# Patient Record
Sex: Male | Born: 1955 | Race: White | Hispanic: No | Smoking: Former smoker
Health system: Southern US, Community
[De-identification: ages and names within clinical notes are randomized; demographics above are authoritative.]

## PROBLEM LIST (undated history)

## (undated) DIAGNOSIS — E785 Hyperlipidemia, unspecified: Secondary | ICD-10-CM

## (undated) DIAGNOSIS — I2510 Atherosclerotic heart disease of native coronary artery without angina pectoris: Secondary | ICD-10-CM

## (undated) DIAGNOSIS — I4510 Unspecified right bundle-branch block: Secondary | ICD-10-CM

## (undated) HISTORY — DX: Atherosclerotic heart disease of native coronary artery without angina pectoris: I25.10

## (undated) HISTORY — PX: CARDIAC CATHETERIZATION: SHX172

## (undated) HISTORY — DX: Unspecified right bundle-branch block: I45.10

## (undated) HISTORY — DX: Hyperlipidemia, unspecified: E78.5

---

## 2003-07-01 ENCOUNTER — Emergency Department (HOSPITAL_COMMUNITY): Admission: EM | Admit: 2003-07-01 | Discharge: 2003-07-01 | Payer: Self-pay | Admitting: *Deleted

## 2003-07-01 ENCOUNTER — Encounter: Payer: Self-pay | Admitting: *Deleted

## 2003-08-08 ENCOUNTER — Encounter: Payer: Self-pay | Admitting: General Surgery

## 2003-08-08 ENCOUNTER — Observation Stay (HOSPITAL_COMMUNITY): Admission: RE | Admit: 2003-08-08 | Discharge: 2003-08-09 | Payer: Self-pay | Admitting: *Deleted

## 2003-08-08 ENCOUNTER — Encounter (INDEPENDENT_AMBULATORY_CARE_PROVIDER_SITE_OTHER): Payer: Self-pay | Admitting: Specialist

## 2005-05-08 ENCOUNTER — Inpatient Hospital Stay (HOSPITAL_COMMUNITY): Admission: EM | Admit: 2005-05-08 | Discharge: 2005-05-13 | Payer: Self-pay | Admitting: Emergency Medicine

## 2005-06-04 ENCOUNTER — Encounter: Admission: RE | Admit: 2005-06-04 | Discharge: 2005-06-04 | Payer: Self-pay | Admitting: Surgery

## 2005-08-29 ENCOUNTER — Encounter (HOSPITAL_COMMUNITY): Admission: RE | Admit: 2005-08-29 | Discharge: 2005-11-27 | Payer: Self-pay | Admitting: Cardiovascular Disease

## 2006-01-28 ENCOUNTER — Emergency Department (HOSPITAL_COMMUNITY): Admission: EM | Admit: 2006-01-28 | Discharge: 2006-01-28 | Payer: Self-pay | Admitting: Emergency Medicine

## 2006-08-11 ENCOUNTER — Encounter: Admission: RE | Admit: 2006-08-11 | Discharge: 2006-08-11 | Payer: Self-pay | Admitting: Family Medicine

## 2007-01-26 IMAGING — CR DG CHEST 1V PORT
1 series · 1 of 1 positions shown · non-contrast
Comparison: Earlier exam today at 3323 hours.

CLINICAL DATA: CABG.  Chest pain. 
 PORTABLE CHEST - 1 VIEW 05/09/05 AT 2022 HOURS:

[view not recorded]
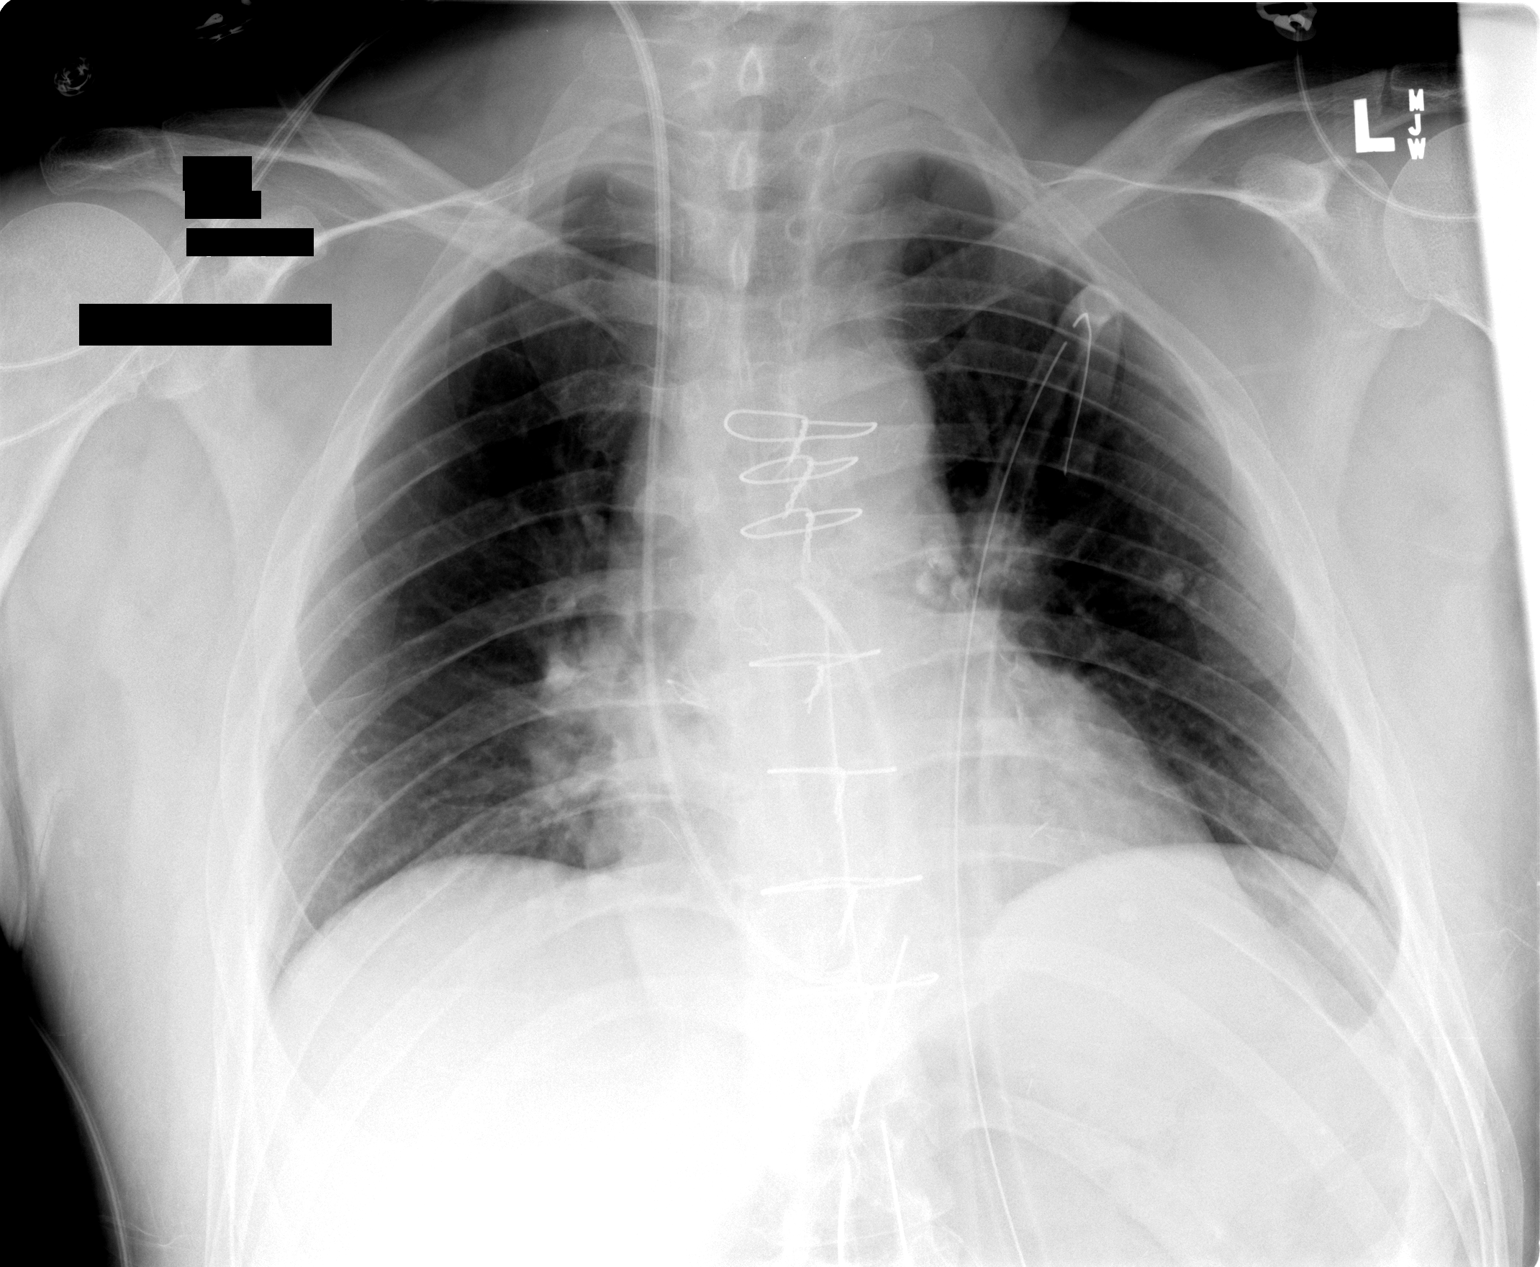

[1 of 1 positions shown; findings below may reference images not displayed]

FINDINGS: Mediastinal and left pleural chest tube in place. Swan Ganz catheter tip main pulmonary artery segment. Status post removal of ET and NG tubes.  Cardiomegaly.
IMPRESSION: CABG.  No acute interval changes.  No pneumothorax.

## 2007-01-28 IMAGING — CR DG CHEST 2V
2 series · 2 of 2 positions shown · non-contrast
Comparison: 05/10/05.

CLINICAL DATA: Post-op form coronary artery bypass grafting.  Chest tube removal.  
 CHEST - 2 VIEW:

[w chest pa]
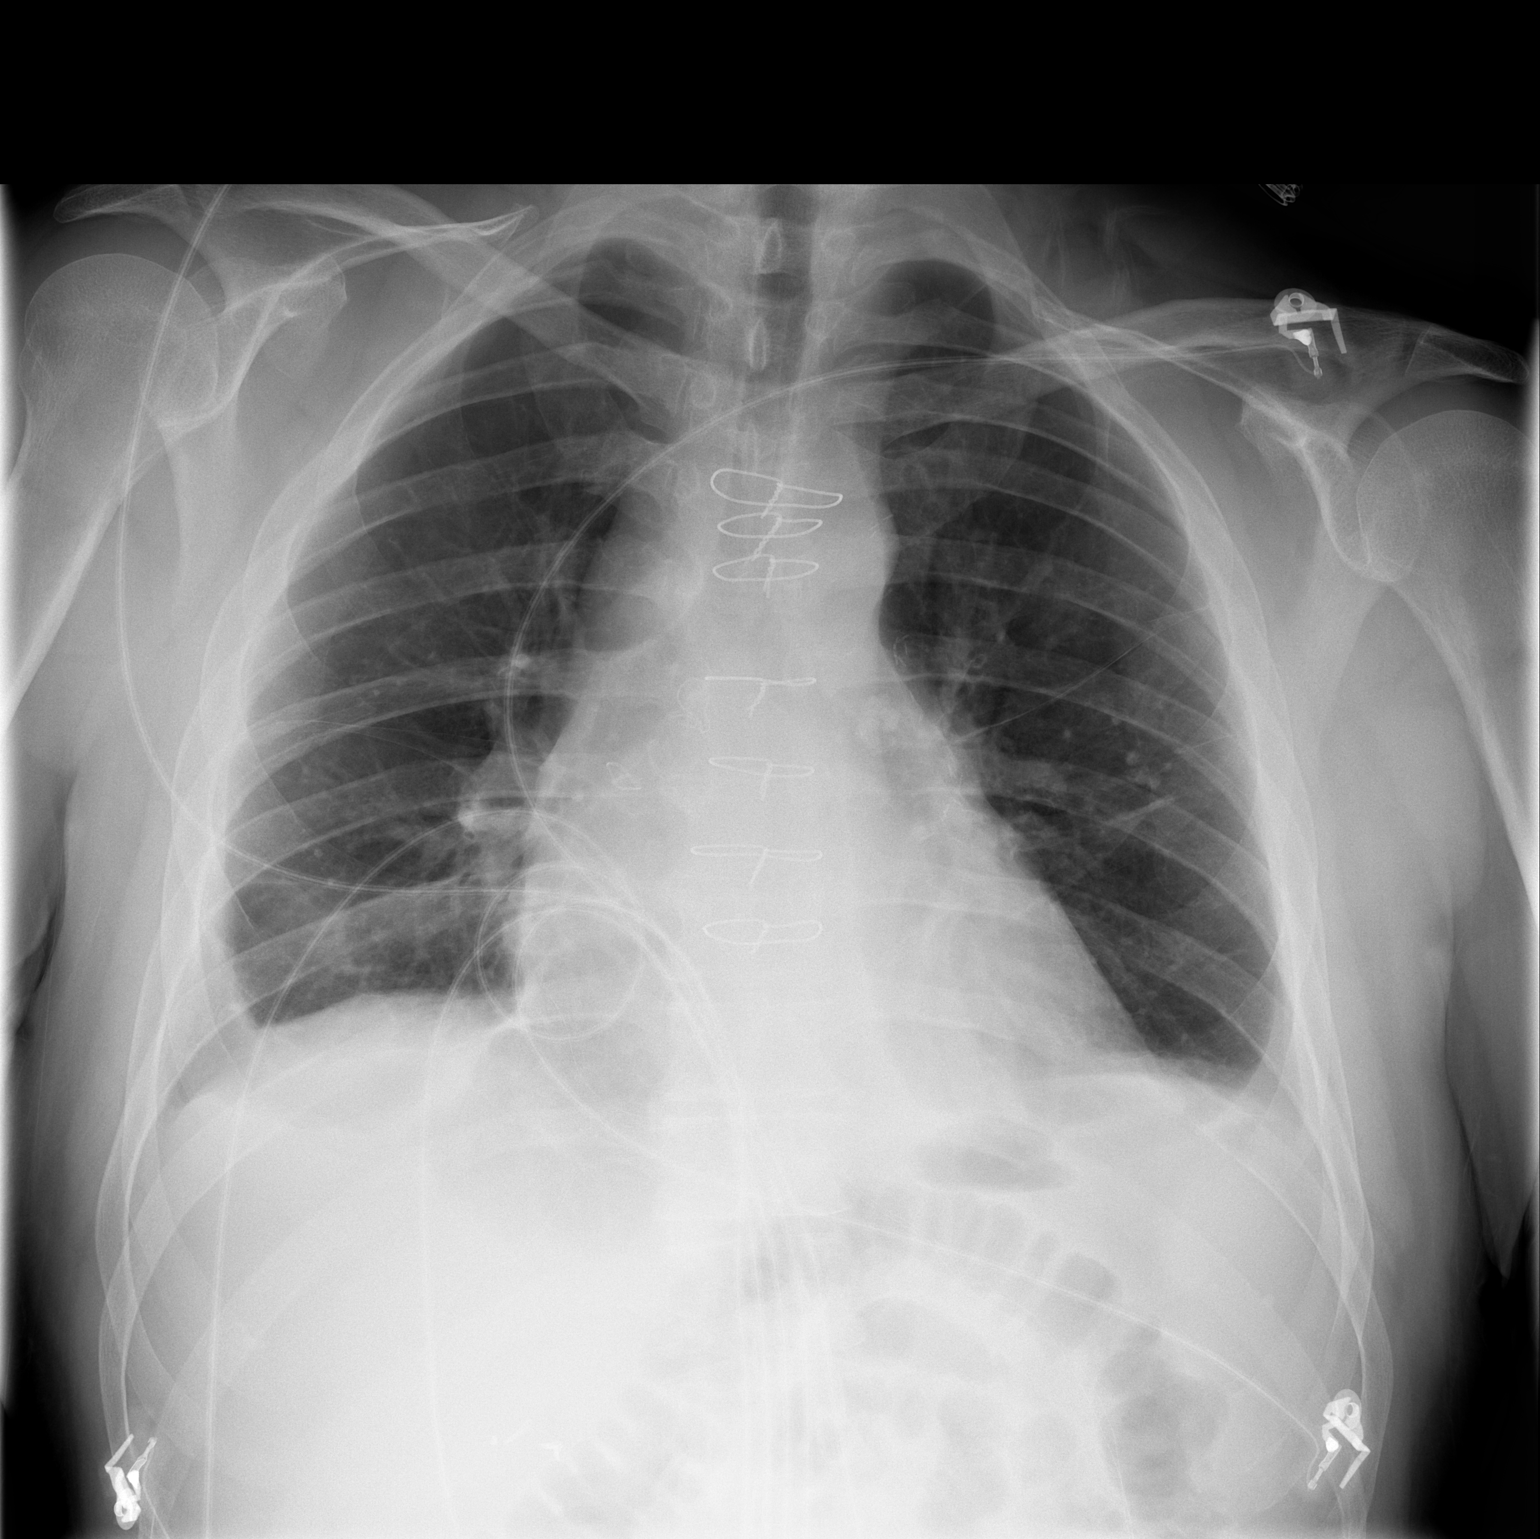

[w chest lat]
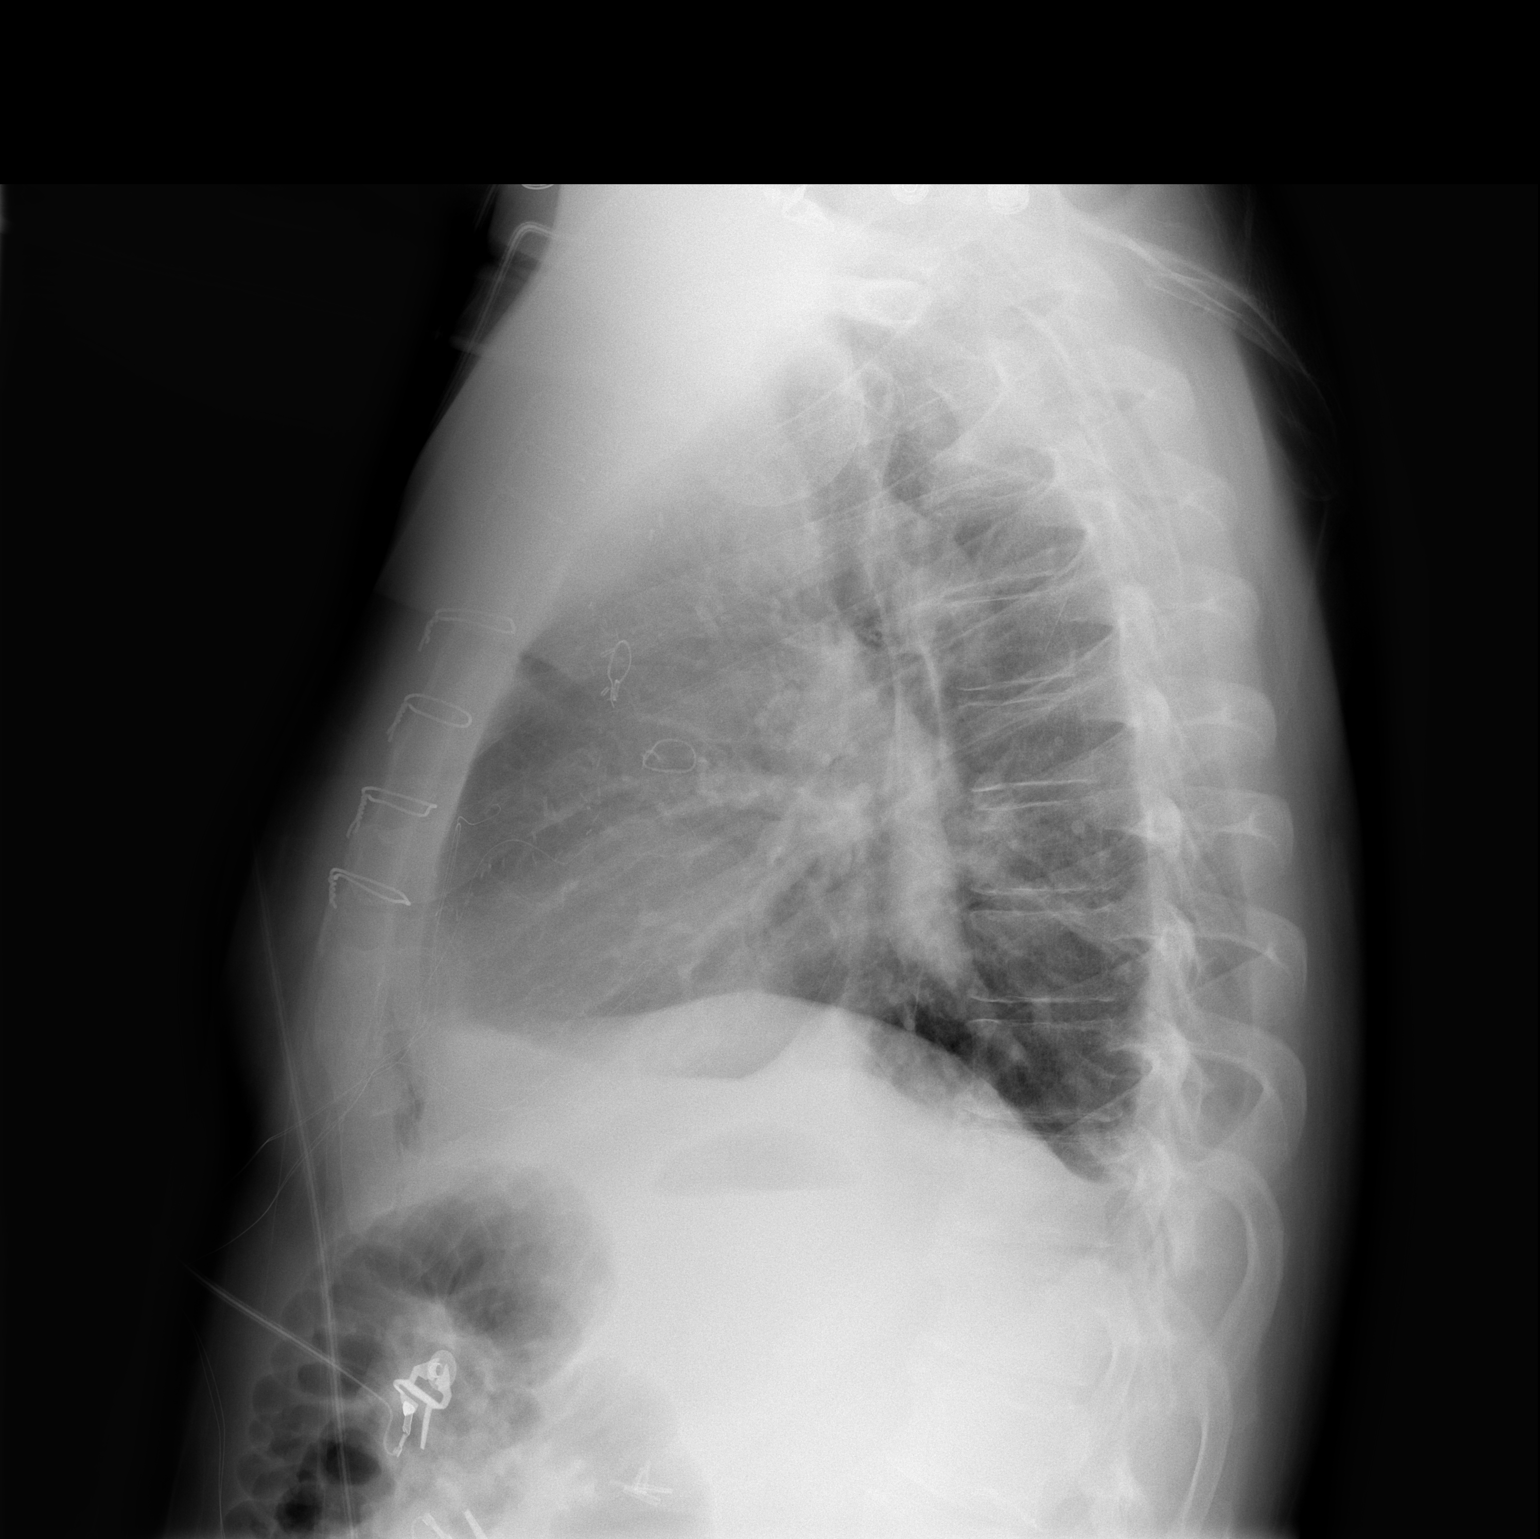

[2 of 2 positions shown; findings below may reference images not displayed]

The left chest tube has been removed.  There is no evidence of pneumothorax.  
 Tiny bilateral pleural effusions are seen as well as mild atelectasis in the medial left lung base.  Mild cardiomegaly is stable.  There is no evidence of congestive heart failure.
IMPRESSION: 1.  No evidence of pneumothorax following left chest tube removal.
 2.  Tiny bilateral pleural effusions and mild atelectasis in retrocardiac left lung base.

## 2008-10-14 ENCOUNTER — Inpatient Hospital Stay (HOSPITAL_COMMUNITY): Admission: EM | Admit: 2008-10-14 | Discharge: 2008-10-16 | Payer: Self-pay | Admitting: Emergency Medicine

## 2008-12-06 ENCOUNTER — Ambulatory Visit (HOSPITAL_BASED_OUTPATIENT_CLINIC_OR_DEPARTMENT_OTHER): Admission: RE | Admit: 2008-12-06 | Discharge: 2008-12-07 | Payer: Self-pay | Admitting: Urology

## 2010-07-03 IMAGING — US US RENAL PORT
1 series · 14 of 25 positions shown · non-contrast
Comparison: None.

CLINICAL DATA: Renal failure.

RENAL/URINARY TRACT ULTRASOUND
TECHNIQUE: Complete ultrasound examination of the urinary tract
was performed including evaluation of the kidneys, renal collecting
systems, and urinary bladder.

[Series 1: unknown · 0.30mm/px · 14 of 26 slices shown]
[im 1/26]
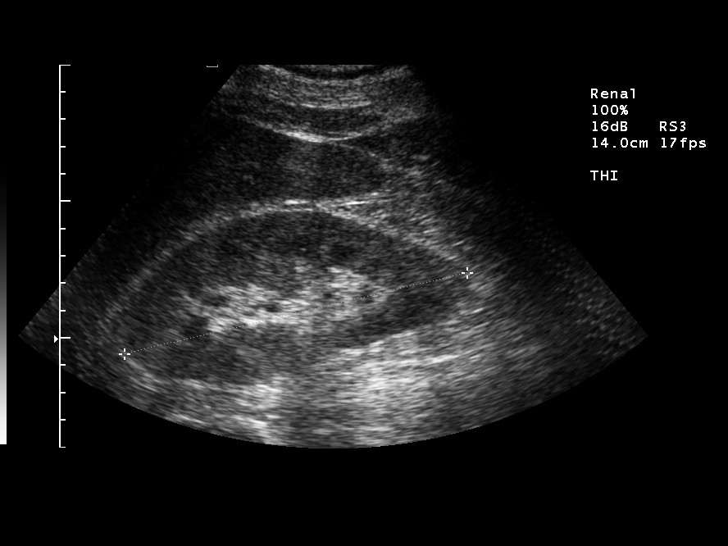
[im 3/26]
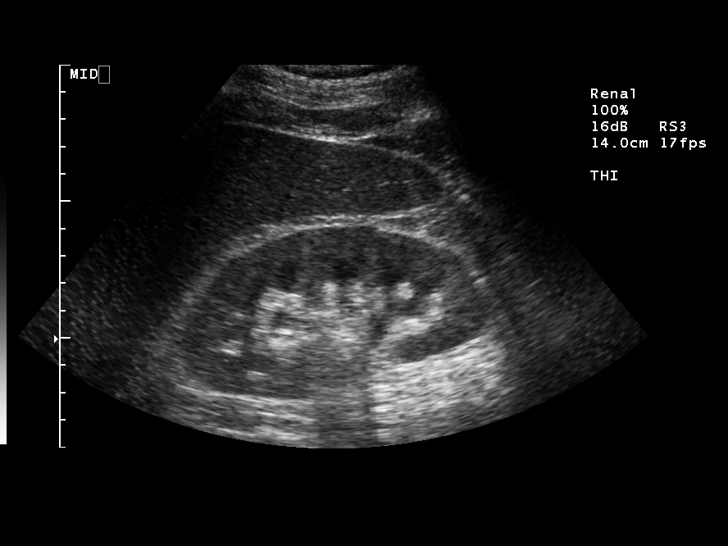
[im 5/26]
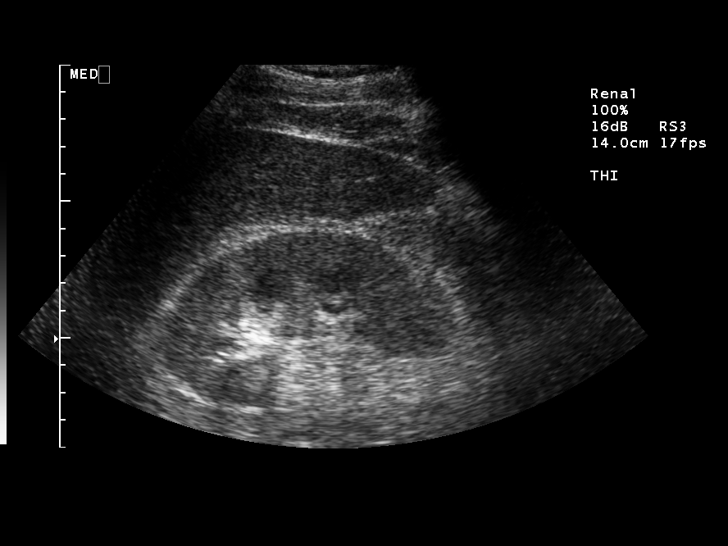
[im 7/26]
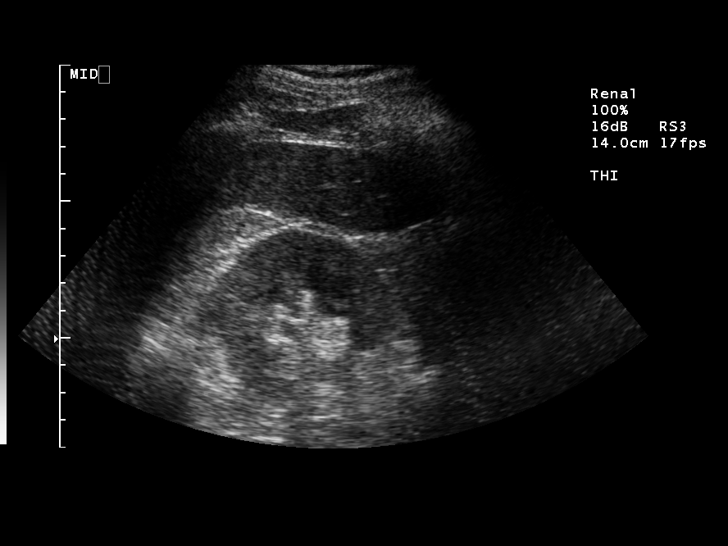
[im 9/26]
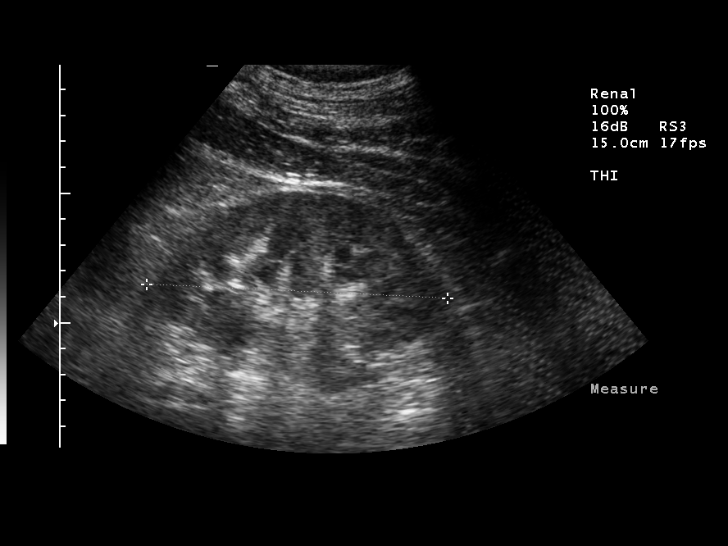
[im 10/26]
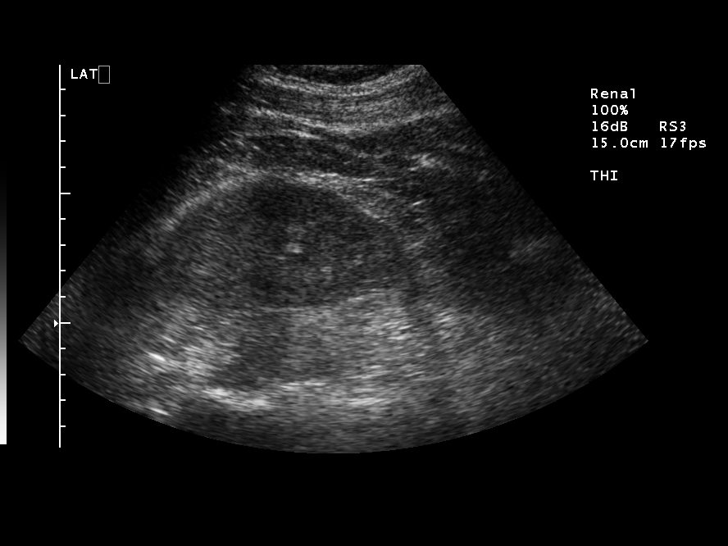
[im 12/26]
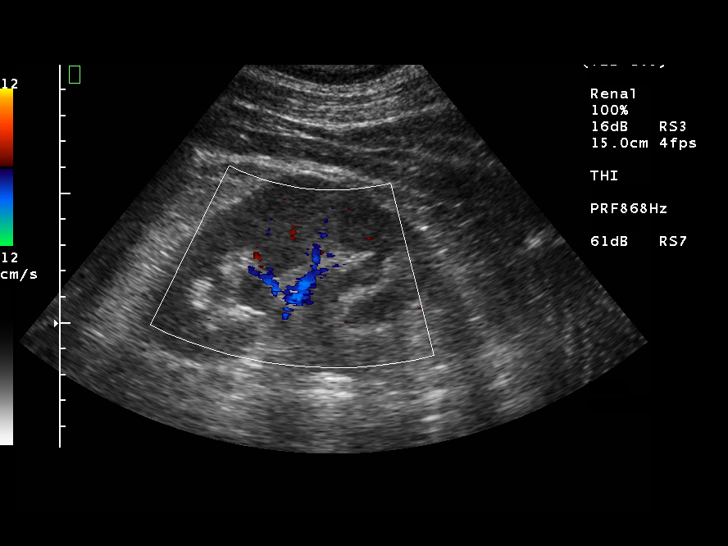
[im 14/26]
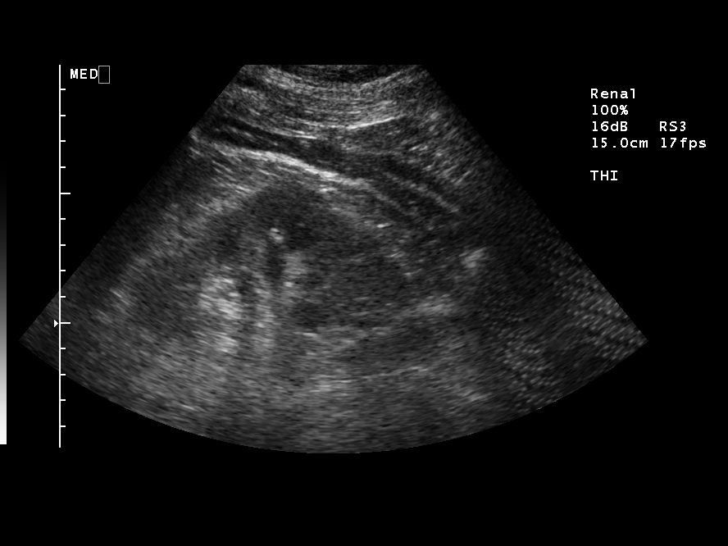
[im 16/26]
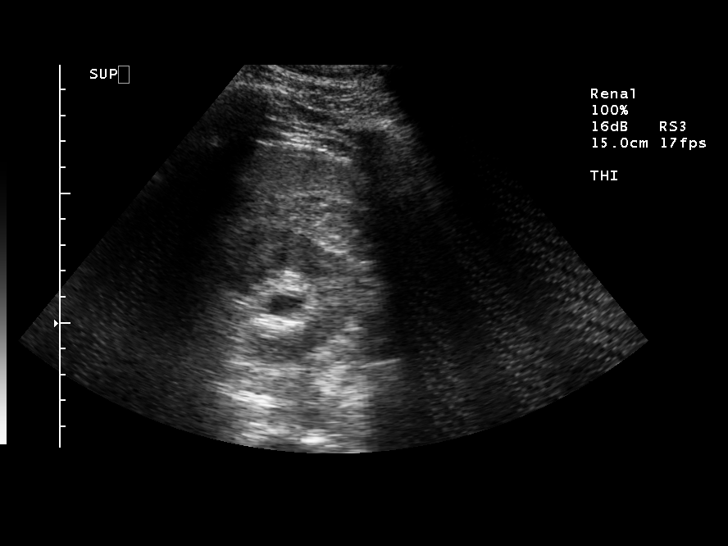
[im 17/26]
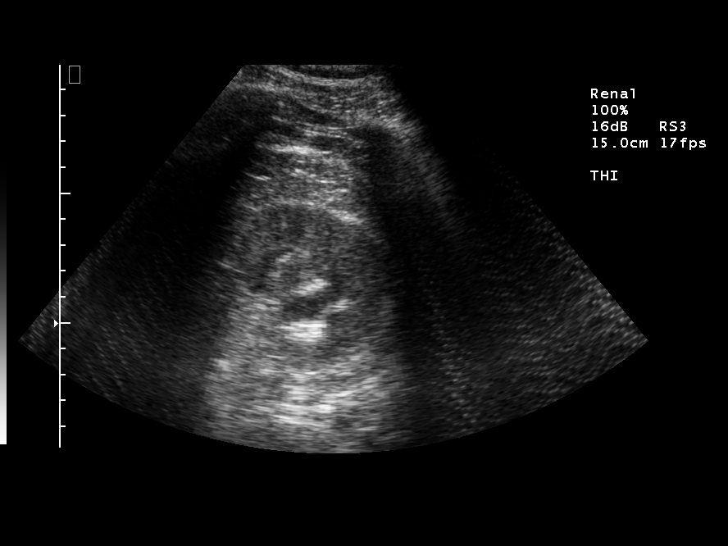
[im 19/26]
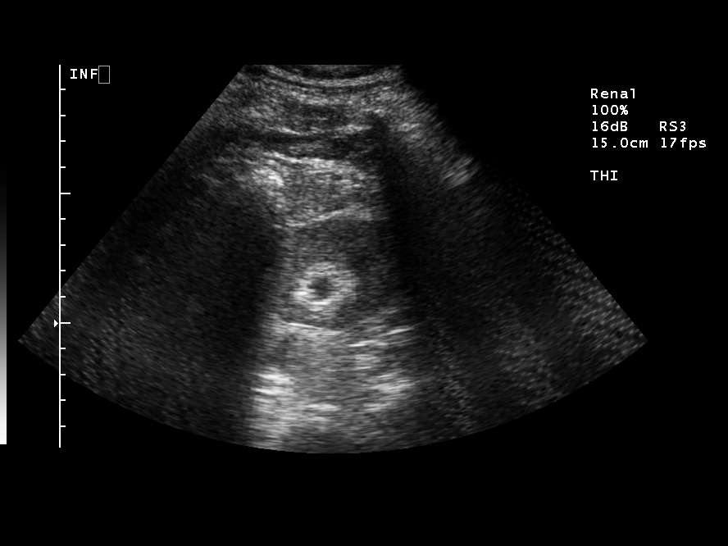
[im 21/26]
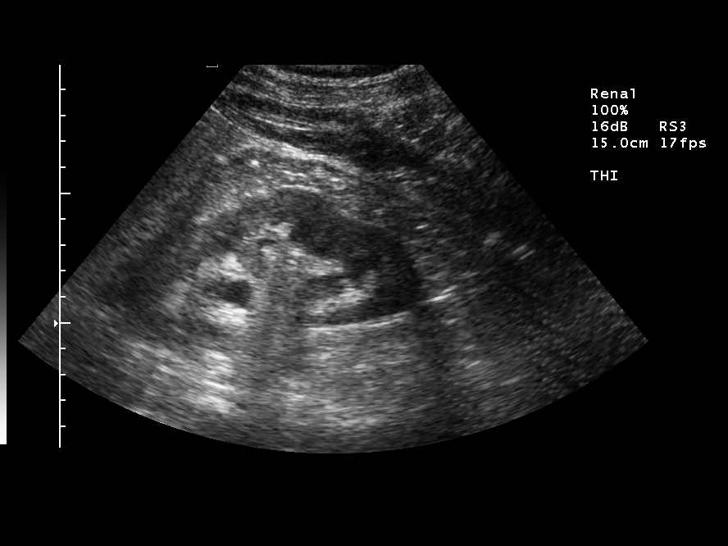
[im 23/26]
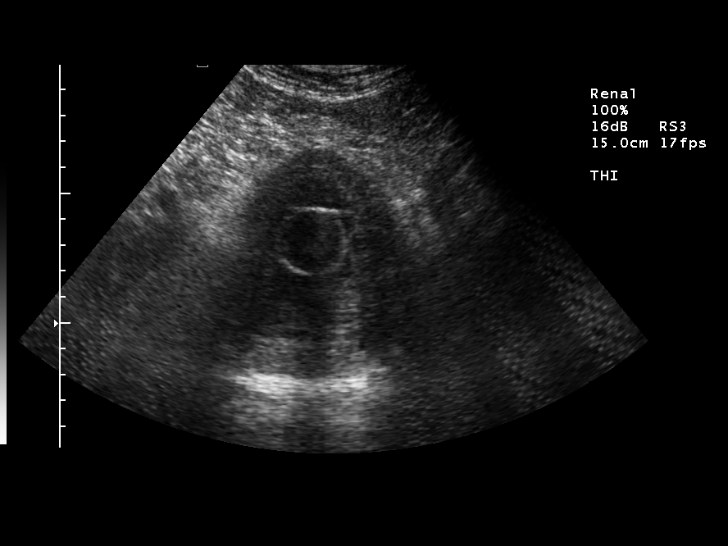
[im 26/26]
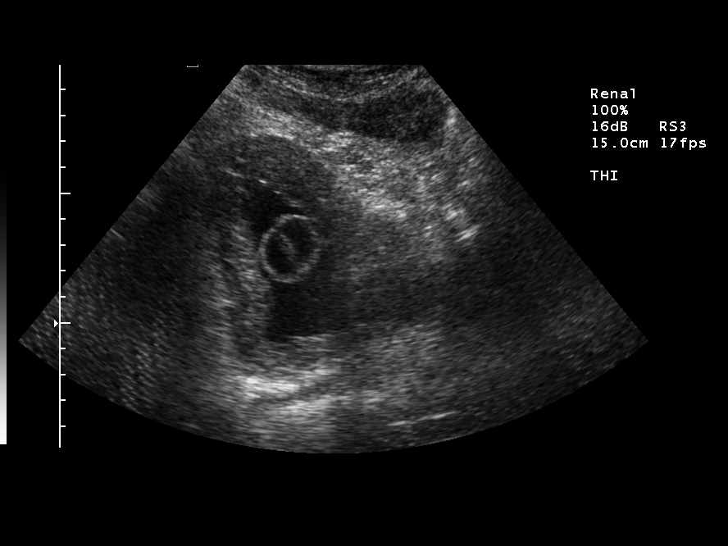

[14 of 25 positions shown; findings below may reference images not displayed]

FINDINGS: The right kidney is 12.9 cm in long axis.  The mild
fullness of the intrarenal collecting system is noted.  The left
kidney measures 11.6 cm.  Left kidney shows slight to a more
prominence of the intrarenal collecting system and seen on the
right, consistent with mild hydronephrosis. Both kidneys have
mildly increased renal cortical echogenicity.

A Foley catheter is visualized within the urinary bladder.  The
bladder wall appears thickened despite the nondistended state of
the bladder.
IMPRESSION: Mild fullness of the right intrarenal collecting system with mild
hydronephrosis in the left kidney.

Mildly increased renal cortical echogenicity bilaterally.

Circumferential bladder wall thickening.

## 2011-02-25 LAB — POCT I-STAT 4, (NA,K, GLUC, HGB,HCT): Sodium: 143 mEq/L (ref 135–145)

## 2011-02-25 LAB — GLUCOSE, CAPILLARY

## 2011-03-26 NOTE — Consult Note (Signed)
NAMEZERIC, BARANOWSKI            ACCOUNT NO.:  [id number]   MEDICAL RECORD NO.:  [id number]          PATIENT TYPE:  INP   LOCATION:  1223                         FACILITY:  [facility]   PHYSICIAN:  Bertram Millard. Dahlstedt, M.D.DATE OF BIRTH:  [birth year]   DATE OF CONSULTATION:  10/14/2008  DATE OF DISCHARGE:                                 CONSULTATION   REASON FOR CONSULTATION:  Renal insufficiency, large residual urine,  hydronephrosis.   BRIEF HISTORY:  A 55 year old male, presenting recently for management  of hypertension.  The patient decreased his antihypertensives, as he  thought that they were causing worsened urinary symptoms.  Over the past  2-3 years, he has developed increasing urinary symptomatology including  frequency, urgency, slow stream, intermittency, feeling of incomplete  emptying, day- and night-time incontinence.  The patient, over the past  week or two, has had severe worsening of these symptoms, and has had  lower abdominal distention and discomfort.  He has never been told that  he had a large prostate before.  He has been put on Cipro recently for  presumed urinary tract infection.  There is a family history, distant,  of prostate cancer.  The patient has not been screened for this, but he  is just 55 years of age.  He saw a urologist about 20 years ago for  prostatitis.  He has not had regular follow-up by urologist, despite  his significant symptomatology at the present time.   The patient presented to the hospital, with significant hypertension.  He also was found to have a creatinine of 5.8.  Catheter was placed.  Urine, 4000 mL, was removed upon catheterization, and since then he has  had a postobstructive diuresis.  He has a stable/normal potassium.  Urologic consultation is requested.   PAST MEDICAL HISTORY:  Is significant for:  1. Hypertension.  2. Depression.  3. Anxiety.  4. GERD.  5. He has a history of coronary artery disease and had a CABG in  2006.      He is followed by Dr. Joselyn Arrow.   MEDICATIONS:  Include:  1. Wellbutrin.  2. Toprol-XL.  3. Recent addition of Cipro.  4. Aspirin.  5. Xanax.  6. Allegra.  7. Ambien.  8. Klonopin.  9. Protonix.  10.Labetalol.   HE IS ALLERGIC TO PENICILLIN WHICH CAUSES A RASH, SULFA WHICH CAUSES A  RASH, ACE INHIBITORS, STATINS AND PHENERGAN.   The patient is unemployed.  He has a remote history of smoking, having  quit 3 years ago.   FAMILY HISTORY:  Significant for coronary artery disease, diabetes  mellitus, hypertension.   REVIEW OF SYSTEMS:  Includes recent lower back pain, loose stools,  dizziness, depression and anxiety.   PHYSICAL EXAMINATION:  Revealed a pleasant but slightly anxious middle-  aged male.  He is slightly obese.  Blood pressure 111/61, pulse 72,  temperature 98.2.  HEENT:  Extraocular movements were intact.  Pupils were equal, round and  reactive to light.  His neck was supple without thyromegaly or adenopathy.  HEART:  Normal rate and rhythm.  LUNGS:  Were clear bilaterally.  ABDOMEN:  Soft, mildly obese, nontender, nondistended.  No mass, no  megaly.  Phallus is circumcised.  No mass, no lesions, catheter present  at meatus.  Testicles were down bilaterally.  Normal rectal exam - no  hemorrhoids.  He had mild prostatic enlargement.  EXTREMITIES:  Were without edema.  He had normal pedal pulses.   Renal ultrasound was reviewed.  There was very minimal right and mild  left hydronephrosis.  Renal parenchyma appeared essentially normal.  Bladder wall appeared normal.  Catheter balloon was present within the  bladder.   IMPRESSION:  1. Urinary retention, significant with large volume.  He is now      treated with catheter.  2. Benign prostatic hypertrophy/bladder outlet obstruction, currently      treated with a catheter drainage.  3. Acute renal failure, secondary to obstructive uropathy.  4. Hydronephrosis, minimal/moderate on renal ultrasound - I  believe      this is probably resolving.   PLAN:  1. I would recommend long-term catheter drainage, at least for the      immediate future.  Eventually, he may be switched to intermittent      catheterization.  Due to his large residual volume/bladder      capacity, it may be some time before he regains bladder function.  2. Due to a postobstructive diuresis, I would slow the patient's fluid      down - this will eventually decrease the diuresis.  I switched his      fluids to keep vein open.  3. I would continue to check his potassium, as he may have a      concentrating defect and have some loss of potassium through his      urine.  4. In the near future, would consider drawing a PSA.  However, this      should not be drawn at the present, as urinary retention and      urethral catheterization can spuriously elevate the PSA.  5. Will continue to follow with you.  EXTREMITIES:      Bertram Millard. Dahlstedt, M.D.  Electronically Signed     SMD/MEDQ  D:  10/14/2008  T:  10/15/2008  Job:  [id number]

## 2011-03-26 NOTE — H&P (Signed)
Gabriel [name], Gabriel [name]            ACCOUNT NO.:  [id number]   MEDICAL RECORD NO.:  [id number]          PATIENT TYPE:  INP   LOCATION:  1223                         FACILITY:  [facility]   PHYSICIAN:  Michiel Cowboy, MDDATE OF BIRTH:  01/10/1956   DATE OF ADMISSION:  10/14/2008  DATE OF DISCHARGE:                              HISTORY & PHYSICAL   PRIMARY CARE [name]:  [name], M.D.   CHIEF COMPLAINT:  The patient was feeling well and had an elevated blood  pressure, which he checked.   HISTORY OF THE PRESENT ILLNESS:  The patient is an unfortunate 52-year-  old gentleman with a history of severe coronary artery disease requiring  a bypass in 2006 secondary to an acute myocardial infection, which was a  severe infarction with severe left main and three-vessel coronary artery  disease.  He also has a history of hypertension and tobacco abuse, which  actually he quit, has a remote history of drug use, history of  depression and anxiety, and GERD.  The patient have been having trouble  with urination for the past few weeks, which is actually ongoing, and he  is unsure not really when it started.  Four months ago he started to  have intermittent urinary incontinence otherwise he had been having  trouble with straining and difficulty initiating a stream, which was  severe.  The patient thought this was secondary to his medications and  he tried changing them around.  He decreased his Wellbutrin dose and he  stopped, for the past few days, actually altogether his Klonopin, of  which he was taking 2 mg twice a day; however, he is not quite sure how  rapidly he decreased that dose.  However, today he developed severe high  blood pressure and was overall generally not feeling well, which brought  him into the emergency department.  Initially his blood pressure was in  the 200 range, but after giving him labetalol it quite come down and  they started  him on a nicardipine drip, which  brought it down to 150.  Currently he is actually off the drip and his blood pressure is still in  the 180s.  He denies any chest pain or shortness of breath; and, has had  no nausea, no vomiting, no fevers and no chills.  He had an  episode of  increased urination throughout this, which then was intermittent with  difficulty in urination.  He presented all these above complaints to his  primary care [name] who started him on Cipro thinking maybe he had a  urinary tract infection.  He has been taking it for the past 3 days.   REVIEW OF SYSTEMS:  Otherwise the review of systems is unremarkable.   PAST MEDICAL HISTORY:  The patient's past medical history is as per the  above and is most significant for acute coronary disease requiring CABG  at the age of 55.   SOCIAL HISTORY:  The patient currently denies smoking, denies using  drugs and does not drink alcohol.   FAMILY HISTORY:  The family history is significant for his father with  coronary artery disease in his 23s, but is otherwise unremarkable.   ALLERGIES:  THE PATIENT IS ALLERGIC TO LIPITOR, PENICILLIN, PHENERGAN,  SULFA, HIGH DOSES WELLBUTRIN, ACE INHIBITORS; AND, STATINS CAUSES  ANGIOEDEMA.   MEDICATIONS:  The patient currently takes:  1. Wellbutrin 150 mg XL daily.  2. Toprol 100 mg daily.  3. Cipro 100 mg twice a day.  4. Aspirin 325 daily.  5. Xanax 1-2 as needed.  6. Allegra 180 as needed.  7. The patient stopped his Klonopin.  8. Also he is taking Ambien CR 10 mg by mouth at bedtime.   PHYSICAL EXAMINATION:  VITAL SIGNS:  Temperature 98.0, blood pressure is  now down to 170/98, but initially if was 189/130, pulse 87, respirations  18, and he is satting 99% on room air.  GENERAL APPEARANCE:  The patient appears to be in no acute distress, but  is very nervous and anxious.  HEENT:  Head is atraumatic.  Somewhat dry mucous membranes.  LUNGS:  The lungs are clear to auscultation bilaterally.  NECK:  The neck  reveals no JVD noted.  HEART:  The heart has a regular rate and rhythm.  No murmurs, rubs or  gallops.  ABDOMEN:  The abdomen is soft, nontender and nondistended.  GENITALIA:  The patient has a Foley catheter in place with about 2900 mL  out of urine.  EXTREMITIES:  The lower extremities are without clubbing, cyanosis or  edema.  NEUROLOGIC EXAMINATION:  Strength 5/5 in all four extremities.  Otherwise he neurologically intact.   LABORATORY DATA:  White blood cell; CBC was not obtained.  Sodium 134,  potassium 5.4 and creatinine 6.31.  Myoglobin 389 otherwise his cardiac  enzymes were unremarkable.  UA showed 3-6 white blood cells and a few  bacteria.  Chest x-ray was within normal limits.  EKG;  somewhat poor  baseline, but no evidence of ST elevation or depression and there are no  Q-waves noted.   ASSESSMENT AND PLAN:  This is a 55 year old gentleman a history of  coronary artery disease who now presents with severe hypertension and  renal failure.   1. Renal failure.  This is likely is secondary to obstruction as the      patient has a history of difficulty and straining to urinate, and      possible overflow incontinence.  It was very difficult to put a      Foley catheter in him secondary to what was thought to be prostatic      hypertrophy; however, the patient was aware of this diagnosis in      the past.  We will obtain a renal ultrasound.  Again other      possibilities are also undifferentiated.  We will obtain a urine      sodium and urine creatinine.  He may need a urology consult.  We      will him start on Flomax, follow creatinine closely and give      intravenous fluids as the patient may have go into overdiuresis.      We will continue to monitor his kidney very closely and if it does      not recover rapidly after placement of the Foley he may need to      have a renal consult.   1. Hypertension with severe blood pressure elevation.  The blood      pressure is  currently under control.  Given that the patient      required  drip we will observe him in the stepdown.  We will write      an order for hydralazine as needed and metoprolol 50 every 6 hours.      The patient has multiple allergies to medications.  His      hypertension could possibly be related stopping his Klonopin and      possibly withdrawal secondary to renal failure; we will see how he      does.  At baseline his blood pressure is usually under better      control.   1. Chronic depression and anxiety.  We will make sure to restart his      Klonopin as the patient could be starting withdrawing from it      since he discontinued suddenly.  We will give him Xanax as needed.      We will hold his Wellbutrin for now as the patient is concerned      that this is contributing to his symptoms.   1. History of coronary artery disease.  Continue metoprolol and      aspirin.   1. The patient is ALLERGIC TO STATINS.  Given elevated blood pressure      and the possibility of hypertensive urgency we will cycle cardiac      enzymes.   1. Prophylaxes.  Protonix plus heparin subcutaneously.  We will avoid      Lovenox while he is in renal failure.      Michiel Cowboy, MD  Electronically Signed     AVD/MEDQ  D:  10/14/2008  T:  10/14/2008  Job:  [id number]   cc:   [name], M.D.  Fax: [phone]

## 2011-03-26 NOTE — Op Note (Signed)
[name], Gabriel [name]            ACCOUNT NO.:  [id number]   MEDICAL RECORD NO.:  [id number]          PATIENT TYPE:  AMB   LOCATION:  [facility]                         FACILITY:  [facility]   PHYSICIAN:  Bertram Millard. Dahlstedt, M.D.DATE OF BIRTH:  Dec 31, 1955   DATE OF PROCEDURE:  12/06/2008  DATE OF DISCHARGE:                               OPERATIVE REPORT   PREOPERATIVE DIAGNOSIS:  Benign prostatic hypertrophy with retention.   POSTOPERATIVE DIAGNOSIS:  Benign prostatic hypertrophy with retention.   PRINCIPAL PROCEDURE:  Transurethral resection of prostate with gyrus  device.   SURGEON:  Bertram Millard. Dahlstedt, M.D.   ANESTHESIA:  General with LMA.   COMPLICATIONS:  None.   BRIEF HISTORY:  A 55 year old male who was seen in December for renal  insufficiency, hydronephrosis and bladder obstruction.  The patient was  found to have prostatic enlargement and obstruction.  He has had a  catheter over the past 7 weeks with resolution of his renal function  and, his hypertension which he was admitted with.  Due to significant  obstruction and long-term symptoms, it was recommended that he undergo  TURP.  He presents at this time for that procedure.  We have talked  about placing a suprapubic tube as well.   DESCRIPTION OF PROCEDURE:  The patient was identified in the holding  area.  He was given IV antibiotics and taken to the operating room where  general anesthetic was administered.  He was placed in the dorsal  lithotomy position.  Genitalia and perineum were prepped and draped.  Lower abdomen was prepped as well.  A 28-French resectoscope sheath was  placed and the gyrus resectoscope/button was placed.  Inspection of the  bladder neck revealed normal appearing orifices well away from the  bladder neck.  There is bilobar hypertrophy.  The prostate was vaporized  in the 12 o'clock and 6 o'clock locations first, using the verumontanum  as a guideline for most distal resection.  The right, and  then the left  lateral lobes were taken down with the gyrus device.  Resection was  taken down to the surgical capsule.  The posterior apical tissue was  also trimmed down to the verumontanum, leaving the verumontanum as a  point of reference.  Excellent resection was performed, with a wide open  fossa at this point.  The bladder neck was split at the 6 o'clock  position at this point.  The cauterization was used to afford adequate  hemostasis.  With no flow, there was no significant bleeding.  Due to  the patient's longstanding retention, and his  initial large bladder volume, a banana suprapubic tube was placed.  This  was sutured to the skin with 2-0 nylon.  That tube was clamped, and a 20-  French 5 mL balloon was placed transurethrally.  The patient tolerated  the procedure well.  He was awakened and taken to the PACU in stable  condition.      Bertram Millard. Dahlstedt, M.D.  Electronically Signed     SMD/MEDQ  D:  12/06/2008  T:  12/06/2008  Job:  [id number]   cc:   Clarene Critchley  Carleene Overlie, M.D.  Fax: [phone]   Nanetta Batty, M.D.  Fax: [phone]

## 2011-03-29 NOTE — Discharge Summary (Signed)
NAMEMONTERRIO, GERST            ACCOUNT NO.:  [id number]   MEDICAL RECORD NO.:  [id number]          PATIENT TYPE:  INP   LOCATION:  2004                         FACILITY:  [facility]   PHYSICIAN:  Evelene Croon, M.D.     DATE OF BIRTH:  23-Mar-1956   DATE OF ADMISSION:  05/08/2005  DATE OF DISCHARGE:  05/13/2005                                 DISCHARGE SUMMARY   ADMISSION DIAGNOSIS:  Acute myocardial infarction.   DISCHARGE/SECONDARY DIAGNOSES:  1.  Acute myocardial infarction with severe left main and three-vessel      coronary artery disease, status post coronary artery bypass graft.  2.  Hypertension.  3.  Tobacco abuse, ongoing.  4.  Remote history of drug use.  5.  History of depression and anxiety.  6.  Gastroesophageal reflux disease.  7.  Allergy to SULFA and PENICILLIN.   PROCEDURES:  1.  On May 08, 2005, emergency mediastinotomy for coronary artery bypass      graft surgery x 3, using the left internal mammary artery to the left      anterior descending artery, saphenous vein graft to the intermediate      coronary artery, saphenous vein graft to the posterior descending branch      of the right coronary artery, endoscopic vein harvesting from the right      leg.  Surgeon:  Dr. Evelene Croon.  2.  On May 08, 2005, cardiac catheterization, by Dr. Nanetta Batty,      showing severe left main and three-vessel coronary artery disease with      preserved left ventricular function.   BRIEF HISTORY:  Mr. Wadding is a 55 year old Caucasian male with  hypertension, hypercholesterolemia, and a history of tobacco abuse who  presented to urgent care, on May 08, 2005, with acute onset of substernal  chest pain which had been ongoing for approximately three hours with  associated shortness of breath and diaphoresis.  ST elevation was noted on  EKG in leads II, III, and aVF.  He was transferred to [facility]  Emergency Department, where he was evaluated by Dr. Nanetta Batty and  treated with IV heparin, nitroglycerin, beta-blockers, aspirin, as well as  emergent cardiac catheterization for acute myocardial infarction.   HOSPITAL COURSE:  On May 08, 2005, Mr. Ciavarella presented to [facility] Emergency Department with symptoms consistent with acute myocardial  infarction.  He underwent emergency cardiac catheterization, by Dr. Nanetta Batty, showing 95% distal left main stenosis with clot present.  There was  an about 80% LAD stenosis.  There was a large intermediate that had about  80% proximal stenosis and the left circumflex was otherwise a small vessel.  The right coronary had about 60-70% mid vessel stenosis.  Left ventricular  function was well preserved.  There was no mitral regurgitation.  Due to his  critical stenosis, Dr. Evelene Croon was consulted regarding __________  emergency coronary artery bypass grafting.  Upon examination of Mr.  Strycharz, and review of his previous procedures, Dr. [name] did feel that an  emergent coronary artery bypass graft surgery was the  best treatment option.  After discussing risks and benefits with Mr. Bunn, he agreed to proceed  and was taken emergently to the operating room.  Intraoperatively, there  were no complications and he was transferred to the surgical intensive care  unit hemodynamically stable.  His postoperative course was relatively not eventful.  By the morning of  postoperative day one, he had been extubated neurologically intact.  Postoperative EKG showed a normal sinus rhythm with no ischemic changes.  Chest x-ray was also stable.  Chest tube output was minimal and chest tubes  were discontinued without incident.  Vitals remained stable.  Initial white  blood count, postoperatively, was elevated to 20,000 which was felt most  likely reactive as a followup white blood counts were trending down and Mr.  Ghrist remained afebrile.  By postoperative day two, he had been   transferred out to the floor.  During his time on unit 2000, he made good  progress.  He was tolerating an oral diet.  He bowel and bladder began  functioning appropriately.  He maintained sinus rhythm.  He was weaned from  supplemental oxygen.  Vital signs remained stable with latest vitals at the  time of this dictation showing blood pressure of 101/69, heart rate 81 and  in sinus rhythm, temperature 97.5, and oxygen saturation 93% on room air.  His pain was controlled on oral medication.  He was ambulating in the  hallways and making progress with cardiac rehab.  He did require short-term  diuretic therapy for mild postoperative fluid volume excess.  He was also  treated for a mildly decreased potassium at 3.7.  His incisions were also  healing well without signs of infection.  Postoperatively, he was restarted  on his preoperative medications for anxiety and depression.  It is felt that  if Mr. Mayall continues to progress in this manner that he will be ready  for discharge home on postoperative day five, May 13, 2005.  External pacing  monitor, chest tubes, sutures will be removed prior to his discharge.  Mr.  Molina lives alone but reports he has multiple friends who will be able to  alternate staying with him and can assist him with discharge care.   LABS:  Most recent labs at the time of this dictation show a white blood  count which is trending down but is still elevated at 16.2 thousand,  hemoglobin 10.6, hematocrit 29.5, platelet count 184.  Sodium 137, potassium  3.7 which was supplemented, chloride 100, CO2 31, BUN 12, creatinine 1.1.  Blood glucose 98.  Liver function tests were normal showing an SGOT of 28,  SGPT of 23, alkaline phosphatase of 58, total bilirubin 0.9, and blood  albumin of 4.5.  A chest x-ray, on May 10, 2005, showed no pneumothorax.  Following chest tube removal, there was mild atelectasis in the lung base  and in the left perihilar region.  DISCHARGE  MEDICATIONS:  1.  Enteric coated aspirin 325 mg one p.o. every day.  2.  Toprol XL 50 mg one p.o. every day.  3.  Altace 2.5 mg one p.o. every day.  4.  Lipitor 80 mg one p.o. q.p.m.  5.  Lexapro 10 mg one p.o. every day.  6.  Ambien q.h.s. p.r.n. for sleep (resume home dose).  7.  Xanax 0.5 mg p.o. t.i.d. p.r.n. anxiety.  8.  Tylox 1-2 tablets p.o. q.4-6h. p.r.n. pain.   DISCHARGE INSTRUCTIONS:  1.  He is instructed to avoid driving or heavy  lifting more than 10 pounds.  2.  He is to continue daily walking and breathing exercises.  3.  He is to follow a low fat, low salt diet.  4.  He may shower and clean his incisions gently with mild soap and water.  5.  He should notify the [facility] office if he develops fever greater than 101,      redness or drainage from incision sites, or increasing shortness of      breath, or weight gain or edema.   FOLLOWUP:  1.  He is to follow up with Dr. [name] at [facility] office in approximately three      weeks.  The [facility] office will contact him regarding a specific      appointment date and time.  2.  He is to have a chest x-ray one hour before this appointment.  This will      be done at [facility], and he was instructed to bring the      chest x-ray film with him to the [facility] office.  3.  He is to call [phone] to schedule two-week followup with Dr. Nanetta Batty.       AWZ/MEDQ  D:  05/11/2005  T:  05/12/2005  Job:  [id number]   cc:   patient's hospital chart   Evelene Croon, M.D.  [address]  [place]  [place] [address]  Fax: [phone]   Nanetta Batty, M.D.  Fax: [phone]   [facility]

## 2011-03-29 NOTE — Op Note (Signed)
NAMEMALLORY, ENRIQUES            ACCOUNT NO.:  [id number]   MEDICAL RECORD NO.:  [id number]          PATIENT TYPE:  INP   LOCATION:  2399                         FACILITY:  [facility]   PHYSICIAN:  Evelene Croon, M.D.     DATE OF BIRTH:  1955-11-21   DATE OF PROCEDURE:  05/08/2005  DATE OF DISCHARGE:                                 OPERATIVE REPORT   PREOPERATIVE DIAGNOSIS:  Severe left main and three-vessel coronary artery  disease.   POSTOPERATIVE DIAGNOSIS:  Severe left main and three-vessel coronary artery  disease.   OPERATION/PROCEDURE:  1.  Emergency median sternotomy, extracorporeal circulation, coronary artery      bypass graft surgery x3 using a left internal mammary artery graft to      the left anterior descending coronary artery, a saphenous vein graft to      the intermediate coronary artery, and a saphenous vein graft to the      posterior descending branch of the right coronary artery.  2.  Endoscopic vein harvesting from the right leg.   SURGEON:  Evelene Croon, M.D.   ASSISTANT:  Pecola Leisure, PA-C.   ANESTHESIA:  General endotracheal anesthesia.   CLINICAL HISTORY:  This patient is a 55 year old gentleman with hypertension  and hypercholesterolemia as well as remote smoking who presented with a  three-hour history of substernal chest pain and electrocardiogram changes  with ST elevation inferiorly. He was taken to the catheterization lab with  ongoing chest pain and was found to have a 95% distal left main stenosis  with thrombus present.  This extended out into the proximal LAD where there  is about 80% stenosis.  The left circumflex essentially gave off a large  intermediate vessel that had about 80% stenosis.  The right coronary artery  had about 60-70% stenosis.  Left ventricular ejection fraction was normal.  The patient was pain-free in the catheterization lab.  After I saw him, I  thought it would be best to proceed with emergent coronary artery  bypass  graft surgery to prevent further ischemia and infarction and death from left  main occlusion.  I discussed the operative procedure with the patient  including alternatives, benefits, and risks including bleeding, blood  transfusion, infection, stroke, myocardial infarction, graft failure, and  death.  He understood and agreed to proceed.   DESCRIPTION OF PROCEDURE:  The patient was taken to the operating room,  placed on the table in the supine position.  After induction of general  endotracheal anesthesia, Foley catheter was placed in the bladder using  sterile technique.  The chest, abdomen and both lower extremities were  prepped and draped in the usual sterile manner.  Chest was entered through a  median sternotomy incision, and the pericardium opened in the midline.  Examination of the heart showed good ventricular contractility.  The  ascending aorta had no palpable plaques in it.   The left internal mammary artery was harvested from the chest wall as a  pedicle graft.  This was a medium caliber vessel with excellent blood flow  through it.  At the same time, a segment of  the greater saphenous vein was  harvested from the right leg using endoscopic vein harvest technique.  This  vein was medium to large caliber and good quality.   Then the patient was heparinized and when an adequate activated clotting  time was achieved, the distal ascending aorta was cannulated using a 20-  [place] aortic cannula for arterial inflow.  The venous outflow was achieved  using a two-stage venous cannula through the right atrial appendage.  Antegrade cardioplegia and vent cannula was inserted in the aortic root.   The patient was placed on cardiopulmonary bypass and the distal coronaries  were identified.  The LAD was heavily diseased at its proximal portion.  It  was a medium size graftable vessel distally.  The intermediate vessel was  heavily diseased in its proximal portion and then  became myocardial.  It was  located at its mid portion within the muscle and was a large graftable  vessel.  The right coronary artery was also diffusely diseased and this  extended out to the take-off of the posterior descending branch.  The  posterior descending branch itself had no significant disease.   Then the aorta was crossclamped and 600 ml of cold blood antegrade  cardioplegia was administered in the aortic root with quick arrest of the  heart.  Systemic hypothermia to 20 degrees C and topical hypothermia with  iced saline was used.  A temperature probe was placed in the septum and  insulating pad in the pericardium.   The first distal anastomosis was performed to the intermediate coronary  artery.  The internal diameter was about 2.5  mm.  The conduit used was a  segment of the greater saphenous vein and anastomosis performed in the end-  to-side  manner using continuous 7-0 Prolene suture.  Flow was noted through  the graft and was excellent.   The second distal anastomosis was performed to the posterior descending  coronary artery.  The internal diameter of this vessel was about 1.75 mm.  The conduit used was the second segment of the greater saphenous vein and  anastomosis performed in the  end-to-side manner using continuous 7-0  Prolene suture.  Flow was noted through the graft and was excellent.   A third distal anastomosis was performed to the mid portion of the left  anterior descending coronary artery.  The internal diameter about 2 mm.  Conduit used was the left internal mammary artery and this was brought  through an opening in the left pericardium anterior to the phrenic nerve.  It was anastomosed to the LAD in end-to-side manner using continuous 8-0  Prolene suture.  The pedicle was sutured to the epicardium with 6-0 Prolene  sutures.   The patient was rewarmed to 37 degrees C.  The two proximal vein graft anastomoses were at the aortic root in end-to-side  manner with continuous 6-  0 Prolene sutures.  Clamp removed from the mammary pedicle.  There was rapid  warming of the ventricular septum and return of spontaneous ventricular  fibrillation.  The crossclamp was removed.  The time was 48 minutes.  The  patient spontaneously converted to sinus rhythm.   The proximal and distal anastomoses appeared hemostatic.  The lie of the  grafts was satisfactory.  Graft markers were placed on the proximal  anastomosis.  Two temporary right ventricular and right atrial pacing wires  were placed and brought out through the skin.   When the patient had been rewarmed to 37 degrees C,  he was weaned from  cardiopulmonary bypass on no inotropic agents.  Total bypass time was 61  minutes.  Cardiac function appeared excellent with cardiac output of 5 L a  minute.  Protamine was given and the venous and aortic cannulas were removed  without difficulty.  Hemostasis was achieved.  Three chest tubes were placed  with a tube in the posterior pericardium, one in the left pleural space and  one in the anterior mediastinum.  The pericardium was reapproximated over  the heart.  Sternum was closed with #6 stainless steel wires.  The fascia  was closed with continuous #1 Vicryl suture.  Subcutaneous tissue was closed  with continuous 2-0 Vicryl and the skin with 3-0 Vicryl subcuticular  closure.  Lower extremity vein harvest site was closed in layers in a  similar manner.  The sponge, needle and instrument counts were correct  according to the scrub  nurse. Dry sterile dressings were applied over the incision around the chest  tubes which were hooked to Pleur-Evac suction.  The patient remained  hemodynamically stable and was transported to the SICU in guarded but stable  condition.       BB/MEDQ  D:  05/09/2005  T:  05/09/2005  Job:  [id number]   cc:   Nanetta Batty, M.D.  Fax: [phone]   Cardiac Cath Lab at [facility]

## 2011-03-29 NOTE — Op Note (Signed)
NAME:  Gabriel [name], Gabriel [name]                      ACCOUNT NO.:  [id number]   MEDICAL RECORD NO.:  [id number]                   PATIENT TYPE:  AMB   LOCATION:  DAY                                  [facility]ACILITY[facility]  [facility]   PHYSICIAN:  Timothy E. Earlene Plater, M.D.              DATE OF BIRTH:  Nov 03, 1956   DATE OF PROCEDURE:  08/08/2003  DATE OF DISCHARGE:                                 OPERATIVE REPORT   PREOPERATIVE DIAGNOSIS:  Acute and chronic cholecystolithiasis.   POSTOPERATIVE DIAGNOSIS:  Acute and chronic cholecystolithiasis.   OPERATION/PROCEDURE:  Laparoscopic cholecystectomy and cholangiogram.   SURGEON:  Timothy E. Earlene Plater, M.D.   ASSISTANT:  Gita Kudo, M.D.   ANESTHESIA:  CRNA, supervised by Dr. Almeta Monas.   INDICATIONS:  Gabriel [name] is 55 years old.  Has otherwise been healthy.  Has had several recent acute episodes of food induced mid epigastric and  right upper quadrant pain.  Seen in the emergency room.  Ultrasound showed  stones.  Liver function studies were normal.  White count was elevated.  He  has been seen and carefully counseled and he wishes to proceed and surgery  has been carefully discussed.  Today his laboratory data are normal.  He is  identified and the permit signed.   DESCRIPTION OF PROCEDURE:  The patient is taken to the operating room and  placed supine. General endotracheal anesthesia administered.  The abdomen is  shaved, scrubbed, prepped and draped in the usual fashion.  Marcaine 0.25%  with epinephrine was used prior to each skin incision.  A vertical skin  incision made at the inferior umbilicus, fascia identified and opened  vertically, peritoneum entered without complications. The Hasson catheter  placed, tied in place with a pursestring suture of #1 Vicryl.  The abdomen  was insufflated.  General peritoneoscopy was unremarkable.  The gallbladder  appeared thickened and shrunken.  A second 10 mm is placed in the mid  epigastrium, two 5 mm trocars  in the right upper quadrant.  Dense adhesions  of the omentum were taken down bluntly with minimal cautery.  The duodenum  was taken off the gallbladder without cautery.  The gallbladder was  inspected.  It appeared thick, woody, and inflamed.  Careful dissection  bluntly at the base of the gallbladder revealed a normal appearing cystic  duct entering the gallbladder.  This was dissected out, a window created, an  anterior artery identified, isolated, triply clipped and divided.  Window  created.  No other structures noted.  A clip placed near the gallbladder on  the cystic duct, the cystic duct opened.  Using the [facility] catheter passed  percutaneously, it was placed into the cystic duct remnant, a clip applied  and cholangiography carried out real time showing rapid filling of the  entire biliary tree with quick flow of dye into the duodenum.  No  abnormalities were noted.  The clip was removed.  The cystic  duct stump was  doubly clipped. Further dissection at the base of the gallbladder through  this acute and chronic inflammation revealed a second small posterior  artery.  This was triply clipped and divided and then using cautious  cautery, the gallbladder was with some difficulty dissected out of the  gallbladder bed.  One laceration was made in the gallbladder.  There was  minimum spillage.  Once removed, the bed was inspected.  It was hemostatic.  Gallbladder was placed in an EndoCatch bag.  Further survey and inspection  with copious irrigation was used to remove all of the old blood and small  amount of bile.  Gallbladder was removed through the infraumbilical incision  which was inspected.  The underside was cauterized as there was a small  amount of blood and then it was dry.  Further inspection carried out was  normal.  Irrigation was used and then all CO2 irrigant.  Instruments and  trocars were removed under direct vision.  Counts correct. The patient was  stable.  The skin  incisions were inspected and closed with subcuticular  Monocryl.  Steri-Strips and dry sterile dressing applied.  He was awakened,  extubated and taken to the recovery room in good condition.   He will be followed in the hospital as indicated and then as an outpatient.                                                 Timothy E. Earlene Plater, M.D.    TED/MEDQ  D:  08/08/2003  T:  08/08/2003  Job:  [id number]   cc:   Deboraha Sprang at [facility]

## 2011-03-29 NOTE — Cardiovascular Report (Signed)
NAMEJEFRY, LESINSKI NO.:  [id number]   MEDICAL RECORD NO.:  [id number]          PATIENT TYPE:  INP   LOCATION:  2309                         FACILITY:  [facility]   PHYSICIAN:  Nanetta Batty, M.D.   DATE OF BIRTH:  April 26, 1956   DATE OF PROCEDURE:  05/08/2005  DATE OF DISCHARGE:                              CARDIAC CATHETERIZATION   INDICATIONS:  Mr. Rimel is a 55 year old single white male with positive  risk factors including discontinued tobacco abuse who developed substernal  chest pain at 3:30 this afternoon. He came to the [facility] Emergency Room where he was found to have inferolateral ST segment  elevation which was treated with IV heparin and nitroglycerin as well as  beta blocker and aspirin. He was brought to the catheter lab for emergent  cardiac catheterization.   DESCRIPTION OF PROCEDURE:  The patient was brought to the 2nd floor [facility] in the postabsorptive state. He was premedicated  with p.o. Valium. His right groin was prepped and shaved in the usual  sterile fashion. Then 1% Xylocaine was used for local anesthesia. A 6-French  sheath was inserted into the right femoral artery and vein using standard  Seldinger technique. A 6-French right and left Judkins diagnostic catheter  as well as 6-French pigtail catheter were used for selective coronary  angiography, left ventriculography, subselective right and left internal  mammary artery angiography, and distal abdominal aortography. Visipaque dye  was used for the entirety of the case. Aorta, ventricular, and pullback  pressures were recorded.   HEMODYNAMIC DATA:  1.  Aortic systolic pressure 151, diastolic pressure 84.  2.  Left ventricular systolic pressure 157, diastolic pressure 22.   SELECTIVE CORONARY ANGIOGRAPHY:  1.  Left main:  Left main had a subocclusive thrombus in the distal portion.  2.  LAD:  A long proximal 70-80% stenosis.  3.   Ramus intermedius branch:  Large distally bifurcating vessel with an 80%      ostial stenosis.  4.  Circumflex:  Small dimunitive vessel.  5.  Right coronary artery:  Dominant vessel with segmental 60-70%      midstenosis.  6.  Left and right internal mammary arteries:  These were subselectively      visualized and were widely patent. There were suitable views during      coronary artery bypass grafting.   LEFT VENTRICULOGRAPHY:  RAO left ventriculogram is performed using 25 cc of  Visipaque dye at 12 cc per second. The overall LVEF is estimated at greater  than 60% without focal wall motion abnormalities.   DISTAL ABDOMINAL AORTOGRAPHY:  Distal abdominal aortogram was performed  utilizing 25 cc of Visipaque dye at 20 cc per second. The renal arteries  were widely patent. The infrarenal abdominal aorta and iliac bifurcation has  the appearance of atherosclerotic changes.   IMPRESSION:  Mr. Bynum has a subocclusive thrombus in the distal left  main with high-grade disease in the proximal left anterior descending artery  and ramus branch and disease in his right coronary artery. He still has  preserved left ventricular  function and ST segment elevation. I believe the  best therapy for him would be emergency  coronary artery bypass grafting. His ACT was documented at 220. The sheaths  were sewed securely in place. Dr. Evelene Croon was notified with [facility]. He  will come and evaluate the patient for surgery. I will defer to Dr. Evelene Croon about whether or not to put in an intra-aortic balloon pump.       JB/MEDQ  D:  05/08/2005  T:  05/09/2005  Job:  [id number]   cc:   Patient's chart   2nd floor cardiac catheter lab   [facility]  [address], [place], [place] [address]

## 2011-03-29 NOTE — Consult Note (Signed)
Gabriel [name], Gabriel [name]            ACCOUNT NO.:  [id number]   MEDICAL RECORD NO.:  [id number]          PATIENT TYPE:  INP   LOCATION:  1826                         FACILITY:  [facility]   PHYSICIAN:  Evelene Croon, M.D.     DATE OF BIRTH:  11-Oct-1956   DATE OF CONSULTATION:  05/08/2005  DATE OF DISCHARGE:                                   CONSULTATION   REFERRING PHYSICIAN:  Nanetta Batty, M.D.   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   CLINICAL HISTORY:  This patient is a 55 year old a gentleman with history of  hypertension, hyperlipidemia and remote smoking who presented to Urgent Care  today with acute onset of substernal chest pain. He was noted to have  electrocardiogram changes with ST elevation in the II, III and aVF. He was  transferred to [facility] Emergency Room where he  continued to have 2/10 chest pain and electrocardiogram changes. He was  taken to the cath lab by cardiology and this showed a 95% distal left main  stenosis with clot present. There was about 80% LAD stenosis. There is a  large intermediate that had about 80% proximal stenosis. Left circumflex was  otherwise a small vessel. The right coronary had about 60-70% a mid vessel  stenosis. Left ventricular function was well-preserved. There is no mitral  regurgitation. He was seen by me in the catheterization lab and was free of  chest pain.   REVIEW OF SYSTEMS:  GENERAL:  He denies fever or chills. He has had no  recent weight changes. He has had some had some fatigue. EYES:  Negative.  ENT:  Negative. ENDOCRINE:  Denies diabetes and hypothyroidism.  CARDIOVASCULAR:  He has had some exertional chest pain for the past few  weeks. He denies shortness of breath. He had no PND or orthopnea.  RESPIRATORY:  Denies cough and sputum production. GI:  He denies nausea or  vomiting. He does have symptoms of gastroesophageal  reflux and irritable  bowel syndrome with some diarrhea. He denies melena and bright red blood per  rectum. GU:  No history of hematuria. NEUROLOGICAL:  Denies any focal  weakness or numbness. No dizziness and syncope. PSYCHIATRIC:  He does have a  history of anxiety and depression.   ALLERGIES:  SULFA and PENICILLIN.   PAST MEDICAL HISTORY:  Significant for hypercholesterolemia and  hypertension. He has history of anxiety and depression that has been  treated. He is status post cholecystectomy. He is status post removal of an  aneurysm from his left leg but does not remember the details about that.   SOCIAL HISTORY:  Child psychotherapist, works for [employer]. He is a remote  smoker but quit in 2004. He denies alcohol and drug abuse. He is single and  has no children.   FAMILY HISTORY:  Unknown to the patient.   PHYSICAL EXAMINATION:  VITAL SIGNS:  His blood pressure is 125/75. His pulse  is 70 and regular. Respiratory rate is 18 and unlabored.  GENERAL:  He is a well-developed white male in  no distress.  HEENT:  Shows him to be normocephalic and atraumatic. Pupils are equal,  reactive to light and accommodation. Extraocular muscles are intact. Throat  is clear.  NECK:  Exam shows normal carotid pulses bilaterally. No bruits. There is no  adenopathy or thyromegaly.  CARDIOVASCULAR:  Cardiac exam shows regular rate and rhythm with normal S1-  S2. There is no murmur or gallop.  LUNGS:  Lungs are clear.  ABDOMEN:  Abdominal exam shows active bowel sounds. Soft and nontender. No  palpable masses or organomegaly.  EXTREMITIES:  Extremity exam shows no peripheral edema. Pedal pulses are  palpable bilaterally.  SKIN:  Skin is warm and dry.  NEUROLOGICAL:  Neurologic exam shows a alert and oriented x3. Motor and  sensory exams grossly normal.   IMPRESSION:  Mr. Gabriel [name] has high grade distal left main stenosis with clot  present as well as three-vessel coronary disease. He presented with  unstable  angina and acute myocardial infarction. He has preserved left ventricular  function. I agree to proceed with emergent coronary bypass graft surgery as  the best treatment to prevent further ischemia, infarction, and death. I  discussed the operative procedure with the patient including alternatives,  benefits, risks including bleeding, blood transfusion, infection, stroke,  myocardial infarction, graft failure, and death. He understands and agrees  to proceed with surgery.       BB/MEDQ  D:  05/08/2005  T:  05/08/2005  Job:  [id number]   cc:   Nanetta Batty, M.D.  Fax: [phone]

## 2011-08-16 LAB — COMPREHENSIVE METABOLIC PANEL
Alkaline Phosphatase: 52 U/L (ref 39–117)
BUN: 40 mg/dL — ABNORMAL HIGH (ref 6–23)
Calcium: 8.8 mg/dL (ref 8.4–10.5)
Glucose, Bld: 101 mg/dL — ABNORMAL HIGH (ref 70–99)
Potassium: 5.3 mEq/L — ABNORMAL HIGH (ref 3.5–5.1)
Total Protein: 7.1 g/dL (ref 6.0–8.3)

## 2011-08-16 LAB — POCT I-STAT, CHEM 8
Creatinine, Ser: 6.7 mg/dL — ABNORMAL HIGH (ref 0.4–1.5)
Glucose, Bld: 115 mg/dL — ABNORMAL HIGH (ref 70–99)
HCT: 44 % (ref 39.0–52.0)
Hemoglobin: 15 g/dL (ref 13.0–17.0)
TCO2: 21 mmol/L (ref 0–100)

## 2011-08-16 LAB — BASIC METABOLIC PANEL
BUN: 24 mg/dL — ABNORMAL HIGH (ref 6–23)
BUN: 33 mg/dL — ABNORMAL HIGH (ref 6–23)
BUN: 38 mg/dL — ABNORMAL HIGH (ref 6–23)
BUN: 40 mg/dL — ABNORMAL HIGH (ref 6–23)
BUN: 42 mg/dL — ABNORMAL HIGH (ref 6–23)
CO2: 22 mEq/L (ref 19–32)
CO2: 23 mEq/L (ref 19–32)
CO2: 26 mEq/L (ref 19–32)
CO2: 31 mEq/L (ref 19–32)
Calcium: 8.8 mg/dL (ref 8.4–10.5)
Calcium: 9.1 mg/dL (ref 8.4–10.5)
Chloride: 103 mEq/L (ref 96–112)
Chloride: 105 mEq/L (ref 96–112)
Chloride: 105 mEq/L (ref 96–112)
Chloride: 107 mEq/L (ref 96–112)
Creatinine, Ser: 4.03 mg/dL — ABNORMAL HIGH (ref 0.4–1.5)
Creatinine, Ser: 5.02 mg/dL — ABNORMAL HIGH (ref 0.4–1.5)
Creatinine, Ser: 6.31 mg/dL — ABNORMAL HIGH (ref 0.4–1.5)
GFR calc Af Amer: 15 mL/min — ABNORMAL LOW (ref >60)
GFR calc Af Amer: 17 mL/min — ABNORMAL LOW (ref >60)
GFR calc Af Amer: 19 mL/min — ABNORMAL LOW (ref >60)
GFR calc Af Amer: 33 mL/min — ABNORMAL LOW (ref >60)
GFR calc non Af Amer: 28 mL/min — ABNORMAL LOW (ref >60)
GFR calc non Af Amer: 9 mL/min — ABNORMAL LOW (ref >60)
Glucose, Bld: 112 mg/dL — ABNORMAL HIGH (ref 70–99)
Glucose, Bld: 115 mg/dL — ABNORMAL HIGH (ref 70–99)
Glucose, Bld: 187 mg/dL — ABNORMAL HIGH (ref 70–99)
Glucose, Bld: 99 mg/dL (ref 70–99)
Potassium: 4 mEq/L (ref 3.5–5.1)
Potassium: 5 mEq/L (ref 3.5–5.1)
Potassium: 5.8 mEq/L — ABNORMAL HIGH (ref 3.5–5.1)
Potassium: 5.9 mEq/L — ABNORMAL HIGH (ref 3.5–5.1)
Sodium: 138 mEq/L (ref 135–145)
Sodium: 138 mEq/L (ref 135–145)

## 2011-08-16 LAB — URINALYSIS, ROUTINE W REFLEX MICROSCOPIC
Bilirubin Urine: NEGATIVE
Glucose, UA: NEGATIVE mg/dL
Ketones, ur: NEGATIVE mg/dL
Leukocytes, UA: NEGATIVE
Nitrite: NEGATIVE
Specific Gravity, Urine: 1.01 (ref 1.005–1.030)
pH: 6.5 (ref 5.0–8.0)

## 2011-08-16 LAB — URINE MICROSCOPIC-ADD ON

## 2011-08-16 LAB — CARDIAC PANEL(CRET KIN+CKTOT+MB+TROPI)
CK, MB: 1.8 ng/mL (ref 0.3–4.0)
CK, MB: 2.7 ng/mL (ref 0.3–4.0)
Relative Index: INVALID (ref 0.0–2.5)
Relative Index: INVALID (ref 0.0–2.5)
Total CK: 61 U/L (ref 7–232)
Total CK: 96 U/L (ref 7–232)
Troponin I: 0.01 ng/mL (ref 0.00–0.06)

## 2011-08-16 LAB — CBC
HCT: 39 % (ref 39.0–52.0)
HCT: 41.8 % (ref 39.0–52.0)
HCT: 41.9 % (ref 39.0–52.0)
HCT: 44.5 % (ref 39.0–52.0)
Hemoglobin: 13.2 g/dL (ref 13.0–17.0)
Hemoglobin: 13.9 g/dL (ref 13.0–17.0)
Hemoglobin: 14.8 g/dL (ref 13.0–17.0)
MCHC: 33.7 g/dL (ref 30.0–36.0)
MCHC: 33.9 g/dL (ref 30.0–36.0)
MCV: 88.3 fL (ref 78.0–100.0)
MCV: 89.5 fL (ref 78.0–100.0)
MCV: 89.5 fL (ref 78.0–100.0)
Platelets: 233 10*3/uL (ref 150–400)
Platelets: 267 10*3/uL (ref 150–400)
RBC: 4.67 MIL/uL (ref 4.22–5.81)
RBC: 4.68 MIL/uL (ref 4.22–5.81)
RBC: 5.04 MIL/uL (ref 4.22–5.81)
RDW: 13 % (ref 11.5–15.5)
WBC: 13.4 10*3/uL — ABNORMAL HIGH (ref 4.0–10.5)
WBC: 16.7 10*3/uL — ABNORMAL HIGH (ref 4.0–10.5)

## 2011-08-16 LAB — RAPID URINE DRUG SCREEN, HOSP PERFORMED
Cocaine: NOT DETECTED
Opiates: POSITIVE — AB

## 2011-08-16 LAB — POCT CARDIAC MARKERS

## 2011-08-16 LAB — UIFE/LIGHT CHAINS/TP QN, 24-HR UR
Alpha 1, Urine: DETECTED — AB
Alpha 2, Urine: DETECTED — AB
Beta, Urine: DETECTED — AB
Free Kappa/Lambda Ratio: 5.24 ratio — ABNORMAL HIGH (ref 0.46–4.00)
Free Lt Chn Excr Rate: 28.88 mg/d
Total Protein, Urine: 134.8 mg/dL
Volume, Urine: 1900 mL

## 2011-08-16 LAB — HEMOGLOBIN A1C
Hgb A1c MFr Bld: 5.2 % (ref 4.6–6.1)
Mean Plasma Glucose: 103 mg/dL

## 2011-08-16 LAB — PROTEIN ELECTROPH W RFLX QUANT IMMUNOGLOBULINS
Alpha-1-Globulin: 5.6 % — ABNORMAL HIGH (ref 2.9–4.9)
Alpha-2-Globulin: 11.5 % (ref 7.1–11.8)
Beta 2: 5.4 % (ref 3.2–6.5)
Beta Globulin: 6 % (ref 4.7–7.2)
Gamma Globulin: 17.9 % (ref 11.1–18.8)

## 2011-08-16 LAB — HEPATIC FUNCTION PANEL
AST: 21 U/L (ref 0–37)
Bilirubin, Direct: 0.2 mg/dL (ref 0.0–0.3)

## 2011-08-16 LAB — CK TOTAL AND CKMB (NOT AT ARMC): Total CK: 106 U/L (ref 7–232)

## 2011-08-16 LAB — DIFFERENTIAL
Basophils Relative: 1 % (ref 0–1)
Eosinophils Relative: 1 % (ref 0–5)
Lymphs Abs: 1.8 10*3/uL (ref 0.7–4.0)
Monocytes Absolute: 1.7 10*3/uL — ABNORMAL HIGH (ref 0.1–1.0)
Monocytes Relative: 10 % (ref 3–12)

## 2011-08-16 LAB — CREATININE, URINE, RANDOM: Creatinine, Urine: 85.1 mg/dL

## 2011-08-16 LAB — PROTIME-INR: Prothrombin Time: 14.8 seconds (ref 11.6–15.2)

## 2011-08-16 LAB — TROPONIN I: Troponin I: 0.01 ng/mL (ref 0.00–0.06)

## 2011-08-16 LAB — TSH: TSH: 2.293 u[IU]/mL (ref 0.350–4.500)

## 2011-08-16 LAB — SODIUM, URINE, RANDOM: Sodium, Ur: 68 mEq/L

## 2011-08-16 LAB — POTASSIUM: Potassium: 4.6 mEq/L (ref 3.5–5.1)

## 2013-05-10 ENCOUNTER — Ambulatory Visit: Payer: Self-pay | Admitting: Cardiovascular Disease

## 2013-05-20 ENCOUNTER — Ambulatory Visit: Payer: Self-pay | Admitting: Cardiovascular Disease

## 2013-06-17 ENCOUNTER — Encounter: Payer: Self-pay | Admitting: *Deleted

## 2013-06-17 ENCOUNTER — Encounter: Payer: Self-pay | Admitting: Cardiovascular Disease

## 2013-06-17 ENCOUNTER — Ambulatory Visit (INDEPENDENT_AMBULATORY_CARE_PROVIDER_SITE_OTHER): Payer: Medicare Other | Admitting: Cardiovascular Disease

## 2013-06-17 VITALS — BP 110/80 | HR 63 | Ht 70.0 in | Wt 195.0 lb

## 2013-06-17 DIAGNOSIS — I2510 Atherosclerotic heart disease of native coronary artery without angina pectoris: Secondary | ICD-10-CM

## 2013-06-17 DIAGNOSIS — R739 Hyperglycemia, unspecified: Secondary | ICD-10-CM | POA: Insufficient documentation

## 2013-06-17 NOTE — Assessment & Plan Note (Signed)
Status post emergent coronary artery bypass grafting by Dr. Rexanne Mano 05/08/05 with a LIMA to his LAD, vein to an OM branch and PDA after I catheterized him and found a subacute thrombosis of his distal left main with 3 vessel disease and normal LV function. His last Myoview performed 7/718/12 is nonischemic. He denies chest pain or shortness of breath.

## 2013-06-17 NOTE — Progress Notes (Signed)
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 He had coronary artery bypass grafting by Gabriel [name] emergently May 08, 2005, with a LIMA to his LAD, vein to an OM branch and to the PDA after I catheterized him and found subacute thrombosis in his distal left main with three vessel disease, normal LV function. His other problems include hyperlipidemia. Recent lab work performed by Dr. Jillyn [name] include lipid profile will be obtained by [place]. He denies chest pain or shortness of breath. He was held a lot of receives in the past but none recently.  Current Outpatient Prescriptions  Medication Sig Dispense Refill  . ALPRAZolam (XANAX) 1 MG tablet Take 1 mg by mouth 2 (two) times daily as needed for sleep.      [name] aspirin 325 MG tablet Take 325 mg by mouth daily.      [name] buPROPion (WELLBUTRIN SR) 150 MG 12 hr tablet Take 150 mg by mouth 2 (two) times daily.      . cholecalciferol (VITAMIN D) 1000 UNITS tablet Take 1,000 Units by mouth 2 (two) times daily. 2 tabs bid for a total of 2000 units.      . clonazePAM (KLONOPIN) 2 MG tablet Take 2 mg by mouth 2 (two) times daily as needed for anxiety.      . fexofenadine (ALLEGRA) 180 MG tablet Take 180 mg by mouth daily.      . fish oil-omega-3 fatty acids 1000 MG capsule Take 1,000 mg by mouth 2 (two) times daily.      . metoprolol succinate (TOPROL-XL) 50 MG 24 hr tablet Take 50 mg by mouth. 1 and 1/2 tablets daily      . mometasone (NASONEX) 50 MCG/ACT nasal spray Place 2 sprays into the nose daily.      . niacin (NIASPAN) 500 MG CR tablet Take 1,000 mg by mouth at bedtime.       . sertraline (ZOLOFT) 50 MG tablet Take 50 mg by mouth daily. Take 1.5 tab daily for a total of 75 mg.      .  simvastatin (ZOCOR) 80 MG tablet Take 80 mg by mouth at bedtime.      [name] zolpidem (AMBIEN CR) 12.5 MG CR tablet Take 12.5 mg by mouth at bedtime as needed for sleep.       No current facility-administered medications for this visit.    Allergies  Allergen Reactions  . Altace (Ramipril)   . Lipitor (Atorvastatin)   . Penicillins   . Sulfa Antibiotics   . Trazodone And Nefazodone     History   Social History  . Marital Status: Divorced    Spouse Name: N/A    Number of Children: N/A  . Years of Education: N/A   Occupational History  . Not on file.   Social History Main Topics  . Smoking status: Former Smoker    Types: Cigarettes    Quit date: 11/11/2002  . Smokeless tobacco: Not on file  . Alcohol Use: Not on file  . Drug Use: Not on file  . Sexually Active: Not on file   Other Topics Concern  . Not on file   Social History Narrative  . No narrative on file     Review  of Systems: General: negative for chills, fever, night sweats or weight changes.  Cardiovascular: negative for chest pain, dyspnea on exertion, edema, orthopnea, palpitations, paroxysmal nocturnal dyspnea or shortness of breath Dermatological: negative for rash Respiratory: negative for cough or wheezing Urologic: negative for hematuria Abdominal: negative for nausea, vomiting, diarrhea, bright red blood per rectum, melena, or hematemesis Neurologic: negative for visual changes, syncope, or dizziness All other systems reviewed and are otherwise negative except as noted above.    Blood pressure 110/80, pulse 63, height 5\' 10"  (1.778 m), weight 195 lb (88.451 kg).  General appearance: alert and no distress Neck: no adenopathy, no carotid bruit, no JVD, supple, symmetrical, trachea midline and thyroid not enlarged, symmetric, no tenderness/mass/nodules Lungs: clear to auscultation bilaterally Heart: regular rate and rhythm, S1, S2 normal, no murmur, click, rub or gallop Extremities: extremities  normal, atraumatic, no cyanosis or edema  EKG normal sinus rhythm at 60 without ST or T wave changes. There was low voltage.  ASSESSMENT AND PLAN:   Coronary artery disease Status post emergent coronary artery bypass grafting by Gabriel [name] 05/08/05 with a LIMA to his LAD, vein to an OM branch and PDA after I catheterized him and found a subacute thrombosis of his distal left main with 3 vessel disease and normal LV function. His last Myoview performed 7/718/12 is nonischemic. He denies chest pain or shortness of breath.      Gabriel Gess MD [name],[name],[name], [facility] 06/17/2013 5:29 PM

## 2013-06-17 NOTE — Patient Instructions (Addendum)
Your physician wants you to follow-up in: 1 year with Dr Berry. You will receive a reminder letter in the mail two months in advance. If you don't receive a letter, please call our office to schedule the follow-up appointment.  

## 2013-08-02 ENCOUNTER — Other Ambulatory Visit: Payer: Self-pay | Admitting: Cardiovascular Disease

## 2013-08-02 NOTE — Telephone Encounter (Signed)
Rx was sent to pharmacy electronically. 

## 2013-09-23 ENCOUNTER — Other Ambulatory Visit: Payer: Self-pay | Admitting: Cardiovascular Disease

## 2013-09-24 NOTE — Telephone Encounter (Signed)
Rx was sent to pharmacy electronically. 

## 2014-05-05 ENCOUNTER — Encounter: Payer: Self-pay | Admitting: Cardiovascular Disease

## 2014-06-13 ENCOUNTER — Encounter: Payer: Self-pay | Admitting: Cardiovascular Disease

## 2014-06-13 ENCOUNTER — Other Ambulatory Visit: Payer: Self-pay | Admitting: Cardiovascular Disease

## 2014-06-13 ENCOUNTER — Ambulatory Visit (INDEPENDENT_AMBULATORY_CARE_PROVIDER_SITE_OTHER): Payer: Medicare Other | Admitting: Cardiovascular Disease

## 2014-06-13 VITALS — BP 110/80 | HR 62 | Ht 69.0 in | Wt 190.0 lb

## 2014-06-13 DIAGNOSIS — E785 Hyperlipidemia, unspecified: Secondary | ICD-10-CM

## 2014-06-13 DIAGNOSIS — I2510 Atherosclerotic heart disease of native coronary artery without angina pectoris: Secondary | ICD-10-CM

## 2014-06-13 DIAGNOSIS — I4510 Unspecified right bundle-branch block: Secondary | ICD-10-CM

## 2014-06-13 MED ORDER — NIACIN ER (ANTIHYPERLIPIDEMIC) 1000 MG PO TBCR: 1000.0000 mg | EXTENDED_RELEASE_TABLET | Freq: Every day | ORAL | Status: DC

## 2014-06-13 NOTE — Progress Notes (Signed)
06/13/2014 Gabriel [name]   January 31, 1956  [id number]  Primary Physician Lavonda JumboKNAPP,EVE A, MD Primary Cardiologist: Runell GessJonathan J. 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 He had coronary artery bypass grafting by Dr. Laneta SimmersBartle emergently May 08, 2005, with a LIMA to his LAD, vein to an OM branch and to the PDA after I catheterized him and found subacute thrombosis in his distal left main with three vessel disease, normal LV function. His other problems include hyperlipidemia. Recent lab work performed by Dr. [name] include lipid profile will be obtained by us. He denies chest pain or shortness of breath.     Current Outpatient Prescriptions  Medication Sig Dispense Refill  . ALPRAZolam (XANAX) 1 MG tablet Take 1 mg by mouth 2 (two) times daily as needed for sleep.      [name]. aspirin 325 MG tablet Take 325 mg by mouth daily.      [name]. buPROPion (WELLBUTRIN SR) 150 MG 12 hr tablet Take 150 mg by mouth 2 (two) times daily.      . cholecalciferol (VITAMIN D) 1000 UNITS tablet Take 1,000 Units by mouth 2 (two) times daily. 2 tabs bid for a total of 2000 units.      . clonazePAM (KLONOPIN) 2 MG tablet Take 2 mg by mouth 2 (two) times daily as needed for anxiety.      . fexofenadine (ALLEGRA) 180 MG tablet Take 180 mg by mouth daily.      . fish oil-omega-3 fatty acids 1000 MG capsule Take 1,000 mg by mouth 2 (two) times daily.      . metoprolol succinate (TOPROL-XL) 50 MG 24 hr tablet Take 1.5 tablets (75mg ) by mouth once daily.  135 tablet  0  . mometasone (NASONEX) 50 MCG/ACT nasal spray Place 2 sprays into the nose daily.      . niacin (NIASPAN) 1000 MG CR tablet Take 1 tablet (1,000 mg total) by mouth at bedtime.  90 tablet  3  . ROZEREM 8 MG tablet Take 1 tablet by mouth at bedtime.      . sertraline (ZOLOFT) 50 MG tablet Take 100 mg by mouth  daily.       . simvastatin (ZOCOR) 80 MG tablet TAKE ONE TABLET BY MOUTH NIGHTLY AT BEDTIME   90 tablet  0  . zolpidem (AMBIEN CR) 12.5 MG CR tablet Take 12.5 mg by mouth at bedtime as needed for sleep.       No current facility-administered medications for this visit.    Allergies  Allergen Reactions  . Altace [Ramipril]   . Lipitor [Atorvastatin]   . Penicillins   . Sulfa Antibiotics   . Trazodone And Nefazodone     History   Social History  . Marital Status: Divorced    Spouse Name: N/A    Number of Children: N/A  . Years of Education: N/A   Occupational History  . Not on file.   Social History Main Topics  . Smoking status: Former Smoker    Types: Cigarettes    Quit date: 11/11/2002  . Smokeless tobacco: Not on file  . Alcohol Use: Not on file  . Drug Use: Not on file  . Sexual Activity: Not on file   Other Topics Concern  . Not on file   Social History Narrative  . No narrative on file  Review of Systems: General: negative for chills, fever, night sweats or weight changes.  Cardiovascular: negative for chest pain, dyspnea on exertion, edema, orthopnea, palpitations, paroxysmal nocturnal dyspnea or shortness of breath Dermatological: negative for rash Respiratory: negative for cough or wheezing Urologic: negative for hematuria Abdominal: negative for nausea, vomiting, diarrhea, bright red blood per rectum, melena, or hematemesis Neurologic: negative for visual changes, syncope, or dizziness All other systems reviewed and are otherwise negative except as noted above.    Blood pressure 110/80, pulse 62, height 5\' 9"  (1.753 m), weight 190 lb (86.183 kg).  General appearance: alert and no distress Neck: no adenopathy, no carotid bruit, no JVD, supple, symmetrical, trachea midline and thyroid not enlarged, symmetric, no tenderness/mass/nodules Lungs: clear to auscultation bilaterally Heart: regular rate and rhythm, S1, S2 normal, no murmur, click, rub or  gallop Extremities: extremities normal, atraumatic, no cyanosis or edema  EKG normal sinus rhythm at 62 with right bundle branch block new since the prior EKG  ASSESSMENT AND PLAN:   Coronary artery disease Status post emergent coronary artery bypass grafting by Dr. [name] 05/08/05 with a LIMA to his LAD vein, vein to OM branch and the PDA after I catheterized him and found subacute thrombosis in his distal left main and three-vessel disease and normal LV function. His last Myoview performed 05/28/11 was nonischemic. He denies chest pain or shortness of breath.  Right bundle branch block New since last EKG  Hyperlipidemia On statin therapy followed by his PCP      [name] MD [facility], [facility] 06/13/2014 3:37 PM

## 2014-06-13 NOTE — Patient Instructions (Signed)
Your physician wants you to follow-up in: 1 year with Dr Berry. You will receive a reminder letter in the mail two months in advance. If you don't receive a letter, please call our office to schedule the follow-up appointment.  

## 2014-06-13 NOTE — Telephone Encounter (Signed)
Rx was sent to pharmacy electronically. 

## 2014-06-13 NOTE — Assessment & Plan Note (Signed)
Status post emergent coronary artery bypass grafting by Dr. Laneta SimmersBartle 05/08/05 with a LIMA to his LAD vein, vein to OM branch and the PDA after I catheterized him and found subacute thrombosis in his distal left main and three-vessel disease and normal LV function. His last Myoview performed 05/28/11 was nonischemic. He denies chest pain or shortness of breath.

## 2014-06-13 NOTE — Assessment & Plan Note (Signed)
On statin therapy followed by his PCP 

## 2014-06-13 NOTE — Assessment & Plan Note (Signed)
New since last EKG 

## 2014-09-11 ENCOUNTER — Other Ambulatory Visit: Payer: Self-pay | Admitting: Cardiovascular Disease

## 2015-06-13 ENCOUNTER — Ambulatory Visit (INDEPENDENT_AMBULATORY_CARE_PROVIDER_SITE_OTHER): Payer: Medicare Other | Admitting: Cardiovascular Disease

## 2015-06-13 ENCOUNTER — Encounter: Payer: Self-pay | Admitting: Cardiovascular Disease

## 2015-06-13 VITALS — BP 122/84 | HR 62 | Ht 69.75 in | Wt 188.0 lb

## 2015-06-13 DIAGNOSIS — I2510 Atherosclerotic heart disease of native coronary artery without angina pectoris: Secondary | ICD-10-CM

## 2015-06-13 DIAGNOSIS — E785 Hyperlipidemia, unspecified: Secondary | ICD-10-CM

## 2015-06-13 DIAGNOSIS — Z79899 Other long term (current) drug therapy: Secondary | ICD-10-CM

## 2015-06-13 DIAGNOSIS — I2583 Coronary atherosclerosis due to lipid rich plaque: Secondary | ICD-10-CM

## 2015-06-13 MED ORDER — METOPROLOL SUCCINATE ER 50 MG PO TB24: 75.0000 mg | ORAL_TABLET | Freq: Every day | ORAL | Status: DC

## 2015-06-13 MED ORDER — NIACIN ER (ANTIHYPERLIPIDEMIC) 1000 MG PO TBCR: 1000.0000 mg | EXTENDED_RELEASE_TABLET | Freq: Every day | ORAL | Status: DC

## 2015-06-13 MED ORDER — SIMVASTATIN 40 MG PO TABS: 40.0000 mg | ORAL_TABLET | Freq: Every day | ORAL | Status: DC

## 2015-06-13 NOTE — Progress Notes (Signed)
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 [name] emergently May 08, 2005, with a LIMA to his LAD, vein to an OM branch and to the PDA after I catheterized him and found subacute thrombosis in his distal left main with three vessel disease, normal LV function. His other problems include hyperlipidemia. Primary care physician, Dr. Cain Saupe , follows his lipid profile. He denies chest pain or shortness of breath. He does have chronic right bundle branch block.   Current Outpatient Prescriptions  Medication Sig Dispense Refill  . ALPRAZolam (XANAX) 1 MG tablet Take 1 mg by mouth 2 (two) times daily as needed for sleep.    Gabriel [name] aspirin 325 MG tablet Take 325 mg by mouth daily.    Gabriel [name] buPROPion (WELLBUTRIN SR) 150 MG 12 hr tablet Take 150 mg by mouth 2 (two) times daily.    . cholecalciferol (VITAMIN D) 1000 UNITS tablet Take 1,000 Units by mouth 2 (two) times daily. 2 tabs bid for a total of 2000 units.    . clonazePAM (KLONOPIN) 2 MG tablet Take 2 mg by mouth 2 (two) times daily as needed for anxiety.    . fexofenadine (ALLEGRA) 180 MG tablet Take 180 mg by mouth daily.    . fish oil-omega-3 fatty acids 1000 MG capsule Take 1,000 mg by mouth 2 (two) times daily.    . metoprolol succinate (TOPROL-XL) 50 MG 24 hr tablet TAKE ONE AND ONE-HALF TABLETS BY MOUTH DAILY  135 tablet 2  . niacin (NIASPAN) 1000 MG CR tablet Take 1 tablet (1,000 mg total) by mouth at bedtime. 90 tablet 3  . sertraline (ZOLOFT) 100 MG tablet Take 100 mg by mouth daily.    . simvastatin (ZOCOR) 40 MG tablet Take 1 tablet (40 mg total) by mouth at bedtime. 30 tablet  11  . Suvorexant (BELSOMRA) 20 MG TABS Take 20 mg by mouth at bedtime.    Gabriel [name] zolpidem (AMBIEN CR) 12.5 MG CR tablet Take 12.5 mg by mouth at bedtime as needed for sleep.     No current facility-administered medications for this visit.    Allergies  Allergen Reactions  . Altace [Ramipril]   . Lipitor [Atorvastatin]   . Penicillins   . Sulfa Antibiotics   . Trazodone And Nefazodone     History   Social History  . Marital Status: Divorced    Spouse Name: N/A  . Number of Children: N/A  . Years of Education: N/A   Occupational History  . Not on file.   Social History Main Topics  . Smoking status: Former Smoker    Types: Cigarettes    Quit date: 11/11/2002  . Smokeless tobacco: Not on file  . Alcohol Use: Not on file  . Drug Use: Not on file  . Sexual Activity: Not on file   Other Topics Concern  . Not on file   Social History Narrative     Review of Systems: General: negative for chills, fever, night sweats or weight changes.  Cardiovascular: negative for chest pain, dyspnea on exertion, edema, orthopnea, palpitations, paroxysmal nocturnal dyspnea or shortness of breath Dermatological: negative for rash  Respiratory: negative for cough or wheezing Urologic: negative for hematuria Abdominal: negative for nausea, vomiting, diarrhea, bright red blood per rectum, melena, or hematemesis Neurologic: negative for visual changes, syncope, or dizziness All other systems reviewed and are otherwise negative except as noted above.    Blood pressure 122/84, pulse 62, height 5' 9.75" (1.772 m), weight 188 lb (85.276 kg).  General appearance: alert and no distress Neck: no adenopathy, no carotid bruit, no JVD, supple, symmetrical, trachea midline and thyroid not enlarged, symmetric, no tenderness/mass/nodules Lungs: clear to auscultation bilaterally Heart: regular rate and rhythm, S1, S2 normal, no murmur, click, rub or gallop Extremities: extremities normal, atraumatic, no  cyanosis or edema  EKG normal sinus rhythm at 62 with right bundle branch block and left axis deviation. I personally reviewed this EKG  ASSESSMENT AND PLAN:   Right bundle branch block chronic  Hyperlipidemia History of hyperlipidemia on Zocor 80 mg a day. He is concerned that this may be contributing to his glucose intolerance. I'm going to decrease him to 40 mg a day and recheck a lipid and liver profile  Coronary artery disease History of CAD status post emergency coronary artery bypass grafting by Dr. [name] 05/08/05 with a LIMA to his LAD, vein to OM branch and PDA after urgent catheterization showed subacute thrombosis of his distal left main with 3 vessel disease and normal LV function.he denies chest pain or shortness of breath      [name] MD [facility], [facility] 06/13/2015 3:51 PM

## 2015-06-13 NOTE — Assessment & Plan Note (Signed)
History of CAD status post emergency coronary artery bypass grafting by Dr. [name] 05/08/05 with a LIMA to his LAD, vein to OM branch and PDA after urgent catheterization showed subacute thrombosis of his distal left main with 3 vessel disease and normal LV function.he denies chest pain or shortness of breath

## 2015-06-13 NOTE — Patient Instructions (Signed)
  We will see you back in follow up in 1 year with Dr Allyson Sabal.   Dr Allyson Sabal has ordered: 1. Decrease Simvastatin to  daily  2. A FASTING lipid profile: to be done in 2 months .  There is a [employer] operational officer lab on the first floor of this building, [address].  They are open from 8am-5pm with a lunch from 12-2.  You do not need an appointment.

## 2015-06-13 NOTE — Assessment & Plan Note (Signed)
chronic

## 2015-06-13 NOTE — Assessment & Plan Note (Signed)
History of hyperlipidemia on Zocor 80 mg a day. He is concerned that this may be contributing to his glucose intolerance. I'm going to decrease him to 40 mg a day and recheck a lipid and liver profile

## 2015-06-21 ENCOUNTER — Telehealth: Payer: Self-pay | Admitting: Cardiovascular Disease

## 2015-06-21 NOTE — Telephone Encounter (Signed)
Please call,concerning his Metoprolol. °

## 2015-06-21 NOTE — Telephone Encounter (Signed)
Spoke to patient. Took several minutes to explain that he is trying to get med refills on same schedule. When asked if refills needed today, he states no. Did state metoprolol he has been taking is . His prescription bottle states .  Upon review, does not appear this dose was change intentionally. Upon chart review, should be taking . Medication list states  (2 pills daily), but when clicked into context menu states  daily. Attempted to fix this but this is blocked out and uneditable. Advised patient to continue  dose as he has been taking, call if problems. Instructed to call when refill needed and make sure dose is noted correctly then. Pt voiced acknowledgment. No further questions or concerns.

## 2015-08-24 ENCOUNTER — Encounter: Payer: Self-pay | Admitting: Cardiovascular Disease

## 2016-06-12 ENCOUNTER — Encounter: Payer: Self-pay | Admitting: Cardiovascular Disease

## 2016-06-12 ENCOUNTER — Ambulatory Visit (INDEPENDENT_AMBULATORY_CARE_PROVIDER_SITE_OTHER): Payer: Medicare Other | Admitting: Cardiovascular Disease

## 2016-06-12 VITALS — BP 116/80 | HR 70 | Ht 69.75 in | Wt 195.0 lb

## 2016-06-12 DIAGNOSIS — E785 Hyperlipidemia, unspecified: Secondary | ICD-10-CM | POA: Diagnosis not present

## 2016-06-12 DIAGNOSIS — I2510 Atherosclerotic heart disease of native coronary artery without angina pectoris: Secondary | ICD-10-CM

## 2016-06-12 DIAGNOSIS — Z0000 Encounter for general adult medical examination without abnormal findings: Secondary | ICD-10-CM

## 2016-06-12 DIAGNOSIS — I4510 Unspecified right bundle-branch block: Secondary | ICD-10-CM | POA: Diagnosis not present

## 2016-06-12 DIAGNOSIS — I2583 Coronary atherosclerosis due to lipid rich plaque: Secondary | ICD-10-CM

## 2016-06-12 NOTE — Patient Instructions (Signed)

## 2016-06-12 NOTE — Assessment & Plan Note (Signed)
Chronic. 

## 2016-06-12 NOTE — Assessment & Plan Note (Signed)
History of CAD status post coronary artery bypass grafting I Dr. [name] emergently 05/08/05. He had a LIMA to his LAD, vein graft to obtuse marginal branch and to the PDA after catheterization showed subacute thrombosis of his distal left main with 3 vessel disease and normal LV function.

## 2016-06-12 NOTE — Assessment & Plan Note (Addendum)
History of hyperlipidemia on statin therapy with recent lipid profile performed 04/30/16 revealed a total cholesterol of 151, LDL 67 and HDL of 48.

## 2016-06-12 NOTE — Progress Notes (Signed)
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 [name] emergently May 08, 2005, with a LIMA to his LAD, vein to an OM branch and to the PDA after I catheterized him and found subacute thrombosis in his distal left main with three vessel disease, normal LV function. His other problems include hyperlipidemia. Primary care physician, Dr. Cain Saupe , follows his lipid profile. He denies chest pain or shortness of breath. He does have chronic right bundle branch block.   Current Outpatient Prescriptions  Medication Sig Dispense Refill  . ALPRAZolam (XANAX) 1 MG tablet Take 1 mg by mouth 3 (three) times daily as needed for sleep.     [name] aspirin 325 MG tablet Take 325 mg by mouth daily.    [name] buPROPion (WELLBUTRIN SR) 150 MG 12 hr tablet Take 150 mg by mouth 2 (two) times daily.    . cholecalciferol (VITAMIN D) 1000 UNITS tablet Take 1,000 Units by mouth 2 (two) times daily. 2 tabs bid for a total of 2000 units.    . clonazePAM (KLONOPIN) 2 MG tablet Take 2 mg by mouth daily.     . fish oil-omega-3 fatty acids 1000 MG capsule Take 1,000 mg by mouth 2 (two) times daily.    . metoprolol succinate (TOPROL-XL) 50 MG 24 hr tablet Take 2 tablets (100 mg total) by mouth daily. Take with or immediately following a meal. 135 tablet 3  . niacin (NIASPAN) 1000 MG CR tablet Take 1 tablet (1,000 mg total) by mouth at bedtime. 90 tablet 3  . sertraline (ZOLOFT) 100 MG tablet Take 100 mg by mouth daily.    . simvastatin (ZOCOR) 40 MG tablet Take 1 tablet (40 mg total) by mouth at bedtime. 90 tablet 3  . Suvorexant (BELSOMRA) 20 MG TABS Take 20 mg by mouth  at bedtime.    [name] zolpidem (AMBIEN CR) 12.5 MG CR tablet Take 12.5 mg by mouth at bedtime as needed for sleep.     No current facility-administered medications for this visit.     Allergies  Allergen Reactions  . Altace [Ramipril]   . Lipitor [Atorvastatin]   . Penicillins   . Sulfa Antibiotics   . Trazodone And Nefazodone     Social History   Social History  . Marital status: Divorced    Spouse name: N/A  . Number of children: N/A  . Years of education: N/A   Occupational History  . Not on file.   Social History Main Topics  . Smoking status: Former Smoker    Types: Cigarettes    Quit date: 11/11/2002  . Smokeless tobacco: Former Neurosurgeon  . Alcohol use Not on file  . Drug use: Unknown  . Sexual activity: Not on file   Other Topics Concern  . Not on file   Social History Narrative  . No narrative on file     Review of Systems: General: negative for chills, fever, night sweats or weight changes.  Cardiovascular: negative for chest pain, dyspnea on exertion, edema, orthopnea, palpitations, paroxysmal nocturnal dyspnea or shortness of breath Dermatological: negative for rash Respiratory: negative for cough or wheezing Urologic:  negative for hematuria Abdominal: negative for nausea, vomiting, diarrhea, bright red blood per rectum, melena, or hematemesis Neurologic: negative for visual changes, syncope, or dizziness All other systems reviewed and are otherwise negative except as noted above.    Blood pressure 116/80, pulse 70, height 5' 9.75" (1.772 m), weight 195 lb (88.5 kg).  General appearance: alert and no distress Neck: no adenopathy, no carotid bruit, no JVD, supple, symmetrical, trachea midline and thyroid not enlarged, symmetric, no tenderness/mass/nodules Lungs: clear to auscultation bilaterally Heart: regular rate and rhythm, S1, S2 normal, no murmur, click, rub or gallop Extremities: extremities normal, atraumatic, no cyanosis or edema  EKG normal sinus  rhythm at 71 with right bundle branch block. I personally reviewed this EKG  ASSESSMENT AND PLAN:   Coronary artery disease History of CAD status post coronary artery bypass grafting I Dr. [name] emergently 05/08/05. He had a LIMA to his LAD, vein graft to obtuse marginal branch and to the PDA after catheterization showed subacute thrombosis of his distal left main with 3 vessel disease and normal LV function.  Hyperlipidemia History of hyperlipidemia on statin therapy with recent lipid profile performed 04/30/16 revealed a total cholesterol of 151, LDL 67 and HDL of 48.  Right bundle branch block Chronic      [name] MD [facility], [facility] 06/12/2016 4:22 PM

## 2016-06-13 ENCOUNTER — Other Ambulatory Visit: Payer: Self-pay | Admitting: Cardiovascular Disease

## 2016-06-13 DIAGNOSIS — E785 Hyperlipidemia, unspecified: Secondary | ICD-10-CM

## 2016-06-13 DIAGNOSIS — Z79899 Other long term (current) drug therapy: Secondary | ICD-10-CM

## 2016-06-13 MED ORDER — METOPROLOL SUCCINATE ER 50 MG PO TB24: ORAL_TABLET | ORAL | 3 refills | Status: DC

## 2016-06-13 NOTE — Addendum Note (Signed)
Addended by: Demetrios Loll on: 06/13/2016 04:56 PM   Modules accepted: Orders

## 2016-06-19 ENCOUNTER — Other Ambulatory Visit: Payer: Self-pay | Admitting: Cardiovascular Disease

## 2016-06-20 ENCOUNTER — Telehealth: Payer: Self-pay | Admitting: Cardiovascular Disease

## 2016-06-20 DIAGNOSIS — Z79899 Other long term (current) drug therapy: Secondary | ICD-10-CM

## 2016-06-20 DIAGNOSIS — E785 Hyperlipidemia, unspecified: Secondary | ICD-10-CM

## 2016-06-20 NOTE — Telephone Encounter (Signed)
New Message  Refill Medications 1. Niaspin/[facility]in [facility] [address] [place] [place], [phone] phone for [facility] 3. 90 day supply

## 2016-08-10 ENCOUNTER — Telehealth: Payer: Self-pay

## 2016-08-10 NOTE — Telephone Encounter (Signed)
Faxed fmla paperwork 08/07/16

## 2017-05-25 ENCOUNTER — Other Ambulatory Visit: Payer: Self-pay | Admitting: Cardiovascular Disease

## 2017-06-13 ENCOUNTER — Ambulatory Visit (INDEPENDENT_AMBULATORY_CARE_PROVIDER_SITE_OTHER): Payer: Medicare Other | Admitting: Cardiovascular Disease

## 2017-06-13 ENCOUNTER — Encounter: Payer: Self-pay | Admitting: Cardiovascular Disease

## 2017-06-13 VITALS — BP 106/80 | HR 69 | Ht 69.75 in | Wt 202.0 lb

## 2017-06-13 DIAGNOSIS — I4510 Unspecified right bundle-branch block: Secondary | ICD-10-CM

## 2017-06-13 DIAGNOSIS — I2510 Atherosclerotic heart disease of native coronary artery without angina pectoris: Secondary | ICD-10-CM

## 2017-06-13 DIAGNOSIS — I2583 Coronary atherosclerosis due to lipid rich plaque: Secondary | ICD-10-CM

## 2017-06-13 DIAGNOSIS — E7800 Pure hypercholesterolemia, unspecified: Secondary | ICD-10-CM | POA: Diagnosis not present

## 2017-06-13 MED ORDER — METOPROLOL SUCCINATE ER 25 MG PO TB24: 75.0000 mg | ORAL_TABLET | Freq: Every day | ORAL | 3 refills | Status: DC

## 2017-06-13 NOTE — Assessment & Plan Note (Signed)
History of hyperlipidemia on statin therapy followed by his PCP 

## 2017-06-13 NOTE — Assessment & Plan Note (Signed)
History of CAD status post emergent coronary artery bypass grafting by Dr. Laneta SimmersBartle 05/08/05 the LIMA to the LAD, vein to OM and PDA after I catheterized him revealing a subacute thrombosis in the distal left main with 3 vessel disease and preserved LV function. He's done well since. He denies chest pain or shortness of breath.

## 2017-06-13 NOTE — Progress Notes (Signed)
06/13/2017 Gabriel [name]   1956/04/19  [id number]  Primary Physician [facility] [place] Primary Cardiologist: Runell GessJonathan J Braxxton Stoudt MD [name], [place]ACC, [place]HA, [place]  HPI:  Gabriel [name] is a 61 y.o. 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 He had coronary artery bypass grafting by Dr. Laneta SimmersBartle emergently May 08, 2005, with a LIMA to his LAD, vein to an OM branch and to the PDA after I catheterized him and found subacute thrombosis in his distal left main with three vessel disease, normal LV function. His other problems include hyperlipidemia. Primary care physician, Dr. Susa SimmondsVia , follows his lipid profile. He denies chest pain or shortness of breath. He does have chronic right bundle branch block.   Current Meds  Medication Sig  . ALPRAZolam (XANAX) 1 MG tablet Take 1 mg by mouth 3 (three) times daily as needed for sleep.   [name]. aspirin 325 MG tablet Take 325 mg by mouth daily.  [name]. buPROPion (WELLBUTRIN SR) 150 MG 12 hr tablet Take 150 mg by mouth 2 (two) times daily.  . cholecalciferol (VITAMIN D) 1000 UNITS tablet Take 1,000 Units by mouth 2 (two) times daily. 2 tabs bid for a total of 2000 units.  . clonazePAM (KLONOPIN) 2 MG tablet Take 2 mg by mouth daily.   . fexofenadine (ALLEGRA) 180 MG tablet Take 180 mg by mouth as needed for allergies or rhinitis.  . fish oil-omega-3 fatty acids 1000 MG capsule Take 1,000 mg by mouth 2 (two) times daily.  . metoprolol succinate (TOPROL-XL) 25 MG 24 hr tablet Take 3 tablets (75 mg total) by mouth daily. Take with or immediately following a meal.  . niacin (NIASPAN) 1000 MG CR tablet TAKE 1 TABLET (1,000 MG TOTAL) BY MOUTH AT BEDTIME.  [name]. sertraline (ZOLOFT) 100 MG tablet Take 100 mg by mouth daily.  . simvastatin (ZOCOR) 40 MG tablet TAKE 1 TABLET (40 MG TOTAL) BY MOUTH AT BEDTIME.  . Suvorexant  (BELSOMRA) 20 MG TABS Take 20 mg by mouth at bedtime.  [name]. zolpidem (AMBIEN CR) 12.5 MG CR tablet Take 12.5 mg by mouth at bedtime as needed for sleep.  . [DISCONTINUED] metoprolol succinate (TOPROL-XL) 50 MG 24 hr tablet Take 75 mg by mouth daily. Take with or immediately following a meal.     Allergies  Allergen Reactions  . Altace [Ramipril]   . Lipitor [Atorvastatin]   . Penicillins   . Sulfa Antibiotics   . Trazodone And Nefazodone     Social History   Social History  . Marital status: Divorced    Spouse name: N/A  . Number of children: N/A  . Years of education: N/A   Occupational History  . Not on file.   Social History Main Topics  . Smoking status: Former Smoker    Types: Cigarettes    Quit date: 11/11/2002  . Smokeless tobacco: Former NeurosurgeonUser  . Alcohol use Not on file  . Drug use: Unknown  . Sexual activity: Not on file   Other Topics Concern  . Not on file   Social History Narrative  . No narrative on file     Review of Systems: General: negative for chills, fever, night sweats or weight changes.  Cardiovascular: negative for chest pain, dyspnea on exertion, edema, orthopnea, palpitations, paroxysmal nocturnal dyspnea or shortness of breath Dermatological: negative for  rash Respiratory: negative for cough or wheezing Urologic: negative for hematuria Abdominal: negative for nausea, vomiting, diarrhea, bright red blood per rectum, melena, or hematemesis Neurologic: negative for visual changes, syncope, or dizziness All other systems reviewed and are otherwise negative except as noted above.    Blood pressure 106/80, pulse 69, height 5' 9.75" (1.772 m), weight 202 lb (91.6 kg).  General appearance: alert and no distress Neck: no adenopathy, no carotid bruit, no JVD, supple, symmetrical, trachea midline and thyroid not enlarged, symmetric, no tenderness/mass/nodules Lungs: clear to auscultation bilaterally Heart: regular rate and rhythm, S1, S2 normal, no  murmur, click, rub or gallop Extremities: extremities normal, atraumatic, no cyanosis or edema  EKG sinus rhythm at 69 with right bundle-branch block and left axis deviation. I personally reviewed this EKG  ASSESSMENT AND PLAN:   Coronary artery disease History of CAD status post emergent coronary artery bypass grafting by Dr. Laneta SimmersBartle 05/08/05 the LIMA to the LAD, vein to OM and PDA after I catheterized him revealing a subacute thrombosis in the distal left main with 3 vessel disease and preserved LV function. He's done well since. He denies chest pain or shortness of breath.  Hyperlipidemia History of hyperlipidemia on statin therapy followed by his PCP  Right bundle branch block Chronic      Runell GessJonathan J. Amory Simonetti MD [facility] SystemFACP,[name],[name], [facility] LLCFSCAI 06/13/2017 3:47 PM

## 2017-06-13 NOTE — Assessment & Plan Note (Signed)
Chronic. 

## 2017-06-13 NOTE — Patient Instructions (Signed)

## 2017-06-14 ENCOUNTER — Other Ambulatory Visit: Payer: Self-pay | Admitting: Cardiovascular Disease

## 2017-06-14 DIAGNOSIS — Z79899 Other long term (current) drug therapy: Secondary | ICD-10-CM

## 2017-06-14 DIAGNOSIS — E785 Hyperlipidemia, unspecified: Secondary | ICD-10-CM

## 2017-06-16 ENCOUNTER — Telehealth: Payer: Self-pay | Admitting: Cardiovascular Disease

## 2017-06-16 MED ORDER — METOPROLOL SUCCINATE ER 25 MG PO TB24: 75.0000 mg | ORAL_TABLET | Freq: Every day | ORAL | 3 refills | Status: DC

## 2017-06-16 NOTE — Telephone Encounter (Signed)
Mr.Rockney Is calling because his Metoprolol was sent to the pharmacy for only 2 months . He is needing to have a 90 day supply . Please call if you have any questions .  Thanks

## 2017-06-16 NOTE — Telephone Encounter (Signed)
Spoke with patient and confirmed the Metoprolol 25 mg 3 daily, [facility] [place]. Advised patient

## 2018-03-23 ENCOUNTER — Ambulatory Visit: Payer: Medicare Other | Admitting: Neurology

## 2018-03-23 VITALS — Ht 69.75 in | Wt 203.0 lb

## 2018-03-23 DIAGNOSIS — G479 Sleep disorder, unspecified: Secondary | ICD-10-CM

## 2018-03-23 NOTE — Progress Notes (Signed)
Subjective:    [name] ID: Gabriel [name] is a 62 y.o. male.  HPI     Gabriel Foley, MD, PhD [facility] [address], [address] [address] [place], [place] [address]  Dear Lawerance Bach,   I saw your [name], Gabriel [name], upon your kind request, in my neurologic clinic today, for initial consultation of his sleep disturbance, in particular difficulty with sleep initiation and sleep maintenance and concern for underlying obstructive sleep apnea. Gabriel [name] is unaccompanied today. As you know, Gabriel [name] is a 62 year old right-handed gentleman with an underlying medical history of hypertension, hyperlipidemia, coronary artery disease with status post CABG in 2006, as well as overweight state, who reports difficulty falling asleep and difficulty staying asleep, snoring and daytime tiredness. His Epworth sleepiness score is 3 out of 24, fatigue score is 48 out of 63. He is single and lives alone, he is retired. He has no children. He quit smoking in 2004 and does not currently utilize any alcohol, drinks caffeine daily. I reviewed your office note from 01/26/2018, which you kindly included. He is currently on Zoloft, Xanax as needed, Wellbutrin, and for sleep he takes Belsomra, Ambien CR and clonazepam. 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 He also reports improved sleep when he is in [place], spends about 3-4 months per year in [place]. His bedtime varies, he will be in bed around 9 on most nights but likes to read until late at night, around 2:30 or 3 AM he will take 20 mg of Belsomra and Ambien CR 12.5 mg. He used to be on regular Ambien generic for years, started around 2002. He reports having had difficulty with sleep for many years, about 30 years. 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 He endorses snoring but does not feel sleepy during Gabriel day. He has never been told that he stops breathing while  asleep. He also takes 2 mg of clonazepam at night, typically around midnight. He takes 2 mg of Xanax typically in Gabriel early morning hours, somewhere between 5 AM and 9 AM, after he wakes up and has difficulty going back to sleep. His rise time varies, can be 11 AM and as late as 2 PM. He has nocturia about once per average night, endorses no family history of OSA. He does not currently wish to pursue a sleep study as he does not believe he has sleep apnea.  His Past Medical History Is Significant For: Past Medical History:  Diagnosis Date  . Coronary artery disease   . Hyperlipidemia   . Right bundle branch block     His Past Surgical History Is Significant For: Past Surgical History:  Procedure Laterality Date  . CARDIAC CATHETERIZATION      His Family History Is Significant For: Family History  Problem Relation Age of Onset  . Diabetes Mother   . Hypertension Mother   . Hypertension Father   . Congestive Heart Failure Father     His Social History Is Significant For: Social History   Socioeconomic History  . Marital status: Divorced    Spouse name: Not on file  . Number of children: Not on file  . Years of education: Not on file  . Highest education level: Not on file  Occupational History  . Not on file  Social Needs  . Financial resource strain: Not on file  . Food insecurity:    Worry: Not  on file    Inability: Not on file  . Transportation needs:    Medical: Not on file    Non-medical: Not on file  Tobacco Use  . Smoking status: Former Smoker    Types: Cigarettes    Last attempt to quit: 11/11/2002    Years since quitting: 15.3  . Smokeless tobacco: Former Engineer, water and Sexual Activity  . Alcohol use: Not on file  . Drug use: Not on file  . Sexual activity: Not on file  Lifestyle  . Physical activity:    Days per week: Not on file    Minutes per session: Not on file  . Stress: Not on file  Relationships  . Social connections:    Talks on phone:  Not on file    Gets together: Not on file    Attends religious service: Not on file    Active member of club or organization: Not on file    Attends meetings of clubs or organizations: Not on file    Relationship status: Not on file  Other Topics Concern  . Not on file  Social History Narrative  . Not on file    His Allergies Are:  Allergies  Allergen Reactions  . Altace [Ramipril]   . Lipitor [Atorvastatin]   . Penicillins   . Sulfa Antibiotics   . Trazodone And Nefazodone   :   His Current Medications Are:  Outpatient Encounter Medications as of 03/23/2018  Medication Sig  . ALPRAZolam (XANAX) 1 MG tablet Take 1 mg by mouth 3 (three) times daily as needed for sleep.   [name] aspirin 325 MG tablet Take 325 mg by mouth daily.  [name] buPROPion (WELLBUTRIN SR) 150 MG 12 hr tablet Take 150 mg by mouth 2 (two) times daily.  . cholecalciferol (VITAMIN D) 1000 UNITS tablet Take 1,000 Units by mouth 2 (two) times daily. 2 tabs bid for a total of 2000 units.  . clonazePAM (KLONOPIN) 2 MG tablet Take 2 mg by mouth daily.   . fexofenadine (ALLEGRA) 180 MG tablet Take 180 mg by mouth as needed for allergies or rhinitis.  . fish oil-omega-3 fatty acids 1000 MG capsule Take 1,000 mg by mouth 2 (two) times daily.  . metoprolol succinate (TOPROL-XL) 25 MG 24 hr tablet Take 3 tablets (75 mg total) by mouth daily. Take with or immediately following a meal.  . niacin (NIASPAN) 1000 MG CR tablet TAKE 1 TABLET (1,000 MG TOTAL) BY MOUTH AT BEDTIME.  [name] sertraline (ZOLOFT) 100 MG tablet Take 100 mg by mouth daily.  . simvastatin (ZOCOR) 40 MG tablet TAKE 1 TABLET (40 MG TOTAL) BY MOUTH AT BEDTIME.  . Suvorexant (BELSOMRA) 20 MG TABS Take 20 mg by mouth at bedtime.  [name] zolpidem (AMBIEN CR) 12.5 MG CR tablet Take 12.5 mg by mouth at bedtime as needed for sleep.   No facility-administered encounter medications on file as of 03/23/2018.   :  Review of Systems:  Out of a complete 14 point review of systems, all  are reviewed and negative with Gabriel exception of these symptoms as listed below: Review of Systems  Neurological:       Pt presents today to discuss his sleep. Pt has never had a sleep study but does endorse snoring. Pt doe snot have insomnia problems while in [place]. Pt has a history of shift work. Pt takes both ambien and belsomra along with xanax.  Epworth Sleepiness Scale 0= would never doze 1= slight chance  of dozing 2= moderate chance of dozing 3= high chance of dozing  Sitting and reading: 1 Watching TV: 1 Sitting inactive in a public place (ex. Theater or meeting): 0 As a passenger in a car for an hour without a break: 0 Lying down to rest in Gabriel afternoon: 1 Sitting and talking to someone: 0 Sitting quietly after lunch (no alcohol): 0 In a car, while stopped in traffic: 0 Total: 3     Objective:  Neurological Exam  Physical Exam Physical Examination:   132/78  General Examination: Gabriel [name] is a very pleasant 62 y.o. male in no acute distress. He appears well-developed and well-nourished and well groomed.   HEENT: Normocephalic, atraumatic, pupils are equal, round and reactive to light and accommodation. Extraocular tracking is good without limitation to gaze excursion or nystagmus noted. Normal smooth pursuit is noted. Hearing is grossly intact. Face is symmetric with normal facial animation and normal facial sensation. Speech is clear with no dysarthria noted. There is no hypophonia. There is no lip, neck/head, jaw or voice tremor. Neck is supple with full range of passive and active motion. There are no carotid bruits on auscultation. Oropharynx exam reveals: moderate mouth dryness, adequate dental hygiene and moderate airway crowding, due to smaller airway entry, redundant soft palate, large uvula, tonsils are small or absent? He reports he has not had a tonsillectomy. Mallampati is class II. Neck circumference is 17-3/4 inches. Tongue protrudes centrally and palate  elevates symmetrically.   Chest: Clear to auscultation without wheezing, rhonchi or crackles noted.  Heart: S1+S2+0, regular and normal without murmurs, rubs or gallops noted.   Abdomen: Soft, non-tender and non-distended with normal bowel sounds appreciated on auscultation.  Extremities: There is no obvious abn.   Skin: Warm and dry without trophic changes noted.  Musculoskeletal: exam reveals no obvious joint deformities, tenderness or joint swelling or erythema.   Neurologically:  Mental status: Gabriel [name] is awake, alert and oriented in all 4 spheres. His immediate and remote memory, attention, language skills and fund of knowledge are appropriate. There is no evidence of aphasia, agnosia, apraxia or anomia. Speech is clear with normal prosody and enunciation. Thought process is linear. Mood is constricted and affect is flat.  Cranial nerves II - XII are as described above under HEENT exam. In addition: shoulder shrug is normal with equal shoulder height noted. Motor exam: Normal bulk, strength and tone is noted. There is no tremor. Fine motor skills and coordination: grossly intact.  Cerebellar testing: No dysmetria or intention tremor. There is no truncal or gait ataxia.  Sensory exam: intact to light touch.  Gait, station and balance: He stands easily. No veering to one side is noted. No leaning to one side is noted. Posture is age-appropriate and stance is narrow based. Gait shows normal stride length and normal pace. No problems turning are noted.   Assessment and Plan:    In summary, Gabriel [name] is a very pleasant 62 y.o.-year old male with an underlying medical history of hypertension, hyperlipidemia, coronary artery disease with status post CABG in 2006, as well as overweight state, who presents for sleep consultation. I suggested we proceed with a sleep study to rule out an underlying organic cause of his chronic sleep difficulties. He would like to think about it. He may  pursue sleep study testing next time he traveled to [place]. He believes that he will be able to pursue this better financially. I explained to him Gabriel sleep study procedure. I  explained to him that an actual sleep study would be Gabriel only way to rule out obstructive sleep apnea. While his history is not telltale for sleep apnea, I explained to him that chronic sleep difficulties including chronic insomnia can be seen with underlying obstructive sleep apnea. In light of his cardiac history, ruling out obstructive sleep apnea would be beneficial. For chronic insomnia he is advised to talk to you about cognitive behavioral therapy. I will see him back on an as-needed basis. Thank you very much for allowing me to participate in Gabriel care of this nice [name]. If I can be of any further assistance to you please do not hesitate to call me at [phone].  Sincerely,   Gabriel Foley, MD, PhD

## 2018-03-23 NOTE — Patient Instructions (Addendum)
Based on your symptoms and your exam I believe we should look for an underlying organic sleep disorder, such as obstructive sleep apnea (OSA), or dream enactments, or periodic leg movement disorder (PLMD, which is typically associated with symptoms of restless leg syndrome, but can be from taking an SSRI type antidepressant).   Please remember to try to maintain good sleep hygiene, which means: Keep a regular sleep and wake schedule, try not to exercise or have a meal within 2 hours of your bedtime, try to keep your bedroom conducive for sleep, that is, cool and dark, without light distractors such as an illuminated alarm clock, and refrain from watching TV right before sleep or in the middle of the night and do not keep the TV or radio on during the night. Also, try not to use or play on electronic devices at bedtime, such as your cell phone, tablet PC or laptop. If you like to read at bedtime on an electronic device, try to dim the background light as much as possible. Do not eat in the middle of the night.   For chronic insomnia, you may benefit from Seeing a psychologist for cognitive behavioral therapy.   I would like for you to think about sleep study testing. We will make a follow up appointment if needed.   If you have more than mild OSA, I want you to consider treatment with CPAP. Please remember, the risks and ramifications of moderate to severe obstructive sleep apnea or OSA are: Cardiovascular disease, including congestive heart failure, stroke, difficult to control hypertension, arrhythmias, and even type 2 diabetes has been linked to untreated OSA. Sleep apnea causes disruption of sleep and sleep deprivation in most cases, which, in turn, can cause recurrent headaches, problems with memory, mood, concentration, focus, and vigilance. Most people with untreated sleep apnea report excessive daytime sleepiness, which can affect their ability to drive. Please do not drive if you feel sleepy.  We  will call you with the sleep study results. Our sleep lab administrative assistant will call you to schedule your sleep study. If you don't hear back from her by about 2 weeks from now, please feel free to call her at [phone]. This is her direct line and please leave a message with your phone number to call back if you get the voicemail box. She will call back as soon as possible.

## 2018-06-13 ENCOUNTER — Other Ambulatory Visit: Payer: Self-pay | Admitting: Cardiovascular Disease

## 2018-06-13 DIAGNOSIS — Z79899 Other long term (current) drug therapy: Secondary | ICD-10-CM

## 2018-06-13 DIAGNOSIS — E785 Hyperlipidemia, unspecified: Secondary | ICD-10-CM

## 2018-06-23 ENCOUNTER — Ambulatory Visit: Payer: Medicare Other | Admitting: Cardiovascular Disease

## 2018-07-07 ENCOUNTER — Encounter: Payer: Self-pay | Admitting: Cardiovascular Disease

## 2018-07-07 ENCOUNTER — Ambulatory Visit: Payer: Medicare Other | Admitting: Cardiovascular Disease

## 2018-07-07 VITALS — BP 128/74 | HR 71 | Ht 69.0 in | Wt 207.4 lb

## 2018-07-07 DIAGNOSIS — I2583 Coronary atherosclerosis due to lipid rich plaque: Secondary | ICD-10-CM | POA: Diagnosis not present

## 2018-07-07 DIAGNOSIS — I2510 Atherosclerotic heart disease of native coronary artery without angina pectoris: Secondary | ICD-10-CM | POA: Diagnosis not present

## 2018-07-07 DIAGNOSIS — E7800 Pure hypercholesterolemia, unspecified: Secondary | ICD-10-CM

## 2018-07-07 DIAGNOSIS — I4510 Unspecified right bundle-branch block: Secondary | ICD-10-CM | POA: Diagnosis not present

## 2018-07-07 NOTE — Patient Instructions (Signed)
Your physician wants you to follow-up in: ONE YEAR WITH DR BERRY You will receive a reminder letter in the mail two months in advance. If you don't receive a letter, please call our office to schedule the follow-up appointment.   If you need a refill on your cardiac medications before your next appointment, please call your pharmacy.  

## 2018-07-07 NOTE — Assessment & Plan Note (Signed)
History of CAD status post emergent coronary artery bypass grafting by Dr. Laneta SimmersBartle 05/08/2005 with a LIMA to the LAD, vein to an OM and PDA after I catheterized him and found subacute thrombosis of his distal left main with three-vessel disease.  Normal LV function.  He denies chest pain or shortness of breath.

## 2018-07-07 NOTE — Assessment & Plan Note (Signed)
History of hyperlipidemia on statin therapy. 

## 2018-07-07 NOTE — Progress Notes (Signed)
07/07/2018 Gabriel [name]   18-Jul-1956  [id number]  Primary Physician College, [facility] Cardiologist: [name] MD [name], [place], [place], [place]  HPI:  Gabriel [name] is a 62 y.o.  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 [name] emergently May 08, 2005, with a LIMA to his LAD, vein to an OM branch and to the PDA after I catheterized him and found subacute thrombosis in his distal left main with three vessel disease, normal LV function. His other problems include hyperlipidemia. Primary care physician, Dr. Susa Simmonds , follows his lipid profile. He denies chest pain or shortness of breath. He does have chronic right bundle branch block.  He started working out at [employer] recently after seeing a PSA on [employer] and is otherwise asymptomatic.   Current Meds  Medication Sig  . ALPRAZolam (XANAX) 1 MG tablet Take 1 mg by mouth 3 (three) times daily as needed for sleep.   [name] aspirin 325 MG tablet Take 325 mg by mouth daily.  [name] buPROPion (WELLBUTRIN SR) 150 MG 12 hr tablet Take 150 mg by mouth 2 (two) times daily.  . cholecalciferol (VITAMIN D) 1000 UNITS tablet Take 1,000 Units by mouth 2 (two) times daily. 2 tabs bid for a total of 2000 units.  . clonazePAM (KLONOPIN) 2 MG tablet Take 2 mg by mouth daily.   . fexofenadine (ALLEGRA) 180 MG tablet Take 180 mg by mouth as needed for allergies or rhinitis.  . fish oil-omega-3 fatty acids 1000 MG capsule Take 1,000 mg by mouth 2 (two) times daily.  . hydrOXYzine (VISTARIL) 25 MG capsule Take 1 capsule by mouth as directed.  . metoprolol succinate (TOPROL-XL) 25 MG 24 hr tablet Take 3 tablets (75 mg total) by mouth daily. KEEP OV.  . niacin (NIASPAN) 1000 MG CR tablet Take 1 tablet (1,000 mg total) by mouth at bedtime. KEEP OV.  . sertraline  (ZOLOFT) 100 MG tablet Take 100 mg by mouth daily.  . simvastatin (ZOCOR) 40 MG tablet Take 1 tablet (40 mg total) by mouth at bedtime. KEEP OV.  . Suvorexant (BELSOMRA) 20 MG TABS Take 20 mg by mouth at bedtime.  . tadalafil (ADCIRCA/CIALIS) 20 MG tablet Take 20 mg by mouth as directed.  . zolpidem (AMBIEN CR) 12.5 MG CR tablet Take 12.5 mg by mouth at bedtime as needed for sleep.     Allergies  Allergen Reactions  . Altace [Ramipril]   . Lipitor [Atorvastatin]   . Penicillins   . Sulfa Antibiotics   . Trazodone And Nefazodone     Social History   Socioeconomic History  . Marital status: Divorced    Spouse name: Not on file  . Number of children: Not on file  . Years of education: Not on file  . Highest education level: Not on file  Occupational History  . Not on file  Social Needs  . Financial resource strain: Not on file  . Food insecurity:    Worry: Not on file    Inability: Not on file  . Transportation needs:    Medical: Not on file    Non-medical: Not on file  Tobacco Use  . Smoking status: Former Smoker    Types: Cigarettes    Last attempt to quit: 11/11/2002  Years since quitting: 15.6  . Smokeless tobacco: Former Engineer, waterUser  Substance and Sexual Activity  . Alcohol use: Not on file  . Drug use: Not on file  . Sexual activity: Not on file  Lifestyle  . Physical activity:    Days per week: Not on file    Minutes per session: Not on file  . Stress: Not on file  Relationships  . Social connections:    Talks on phone: Not on file    Gets together: Not on file    Attends religious service: Not on file    Active member of club or organization: Not on file    Attends meetings of clubs or organizations: Not on file    Relationship status: Not on file  . Intimate partner violence:    Fear of current or ex partner: Not on file    Emotionally abused: Not on file    Physically abused: Not on file    Forced sexual activity: Not on file  Other Topics Concern  . Not  on file  Social History Narrative  . Not on file     Review of Systems: General: negative for chills, fever, night sweats or weight changes.  Cardiovascular: negative for chest pain, dyspnea on exertion, edema, orthopnea, palpitations, paroxysmal nocturnal dyspnea or shortness of breath Dermatological: negative for rash Respiratory: negative for cough or wheezing Urologic: negative for hematuria Abdominal: negative for nausea, vomiting, diarrhea, bright red blood per rectum, melena, or hematemesis Neurologic: negative for visual changes, syncope, or dizziness All other systems reviewed and are otherwise negative except as noted above.    Blood pressure 128/74, pulse 71, height 5\' 9"  (1.753 m), weight 207 lb 6.4 oz (94.1 kg).  General appearance: alert and no distress Neck: no adenopathy, no carotid bruit, no JVD, supple, symmetrical, trachea midline and thyroid not enlarged, symmetric, no tenderness/mass/nodules Lungs: clear to auscultation bilaterally Heart: regular rate and rhythm, S1, S2 normal, no murmur, click, rub or gallop Extremities: extremities normal, atraumatic, no cyanosis or edema Pulses: 2+ and symmetric Skin: Skin color, texture, turgor normal. No rashes or lesions Neurologic: Alert and oriented X 3, normal strength and tone. Normal symmetric reflexes. Normal coordination and gait  EKG sinus rhythm at 71 with right bundle branch block and left axis deviation.  Personally reviewed this EKG.  ASSESSMENT AND PLAN:   Coronary artery disease History of CAD status post emergent coronary artery bypass grafting by Dr. Laneta SimmersBartle 05/08/2005 with a LIMA to the LAD, vein to an OM and PDA after I catheterized him and found subacute thrombosis of his distal left main with three-vessel disease.  Normal LV function.  He denies chest pain or shortness of breath.  Hyperlipidemia History of hyperlipidemia on statin therapy  Right bundle branch block Chronic      Runell GessJonathan J. Etty Isaac  MD [facility] EndoFACP,[name],[name], [facility] 07/07/2018 4:09 PM

## 2018-07-07 NOTE — Assessment & Plan Note (Signed)
Chronic. 

## 2018-09-08 ENCOUNTER — Other Ambulatory Visit: Payer: Self-pay | Admitting: Cardiovascular Disease

## 2018-09-10 ENCOUNTER — Other Ambulatory Visit: Payer: Self-pay | Admitting: Cardiovascular Disease

## 2018-09-10 DIAGNOSIS — E785 Hyperlipidemia, unspecified: Secondary | ICD-10-CM

## 2018-09-10 DIAGNOSIS — Z79899 Other long term (current) drug therapy: Secondary | ICD-10-CM

## 2018-09-10 NOTE — Telephone Encounter (Signed)
Rx has been sent to the pharmacy electronically. ° °

## 2019-06-07 ENCOUNTER — Other Ambulatory Visit: Payer: Self-pay | Admitting: Cardiovascular Disease

## 2019-06-07 DIAGNOSIS — E785 Hyperlipidemia, unspecified: Secondary | ICD-10-CM

## 2019-06-07 DIAGNOSIS — Z79899 Other long term (current) drug therapy: Secondary | ICD-10-CM

## 2019-07-09 ENCOUNTER — Encounter: Payer: Self-pay | Admitting: Cardiovascular Disease

## 2019-07-09 ENCOUNTER — Ambulatory Visit (INDEPENDENT_AMBULATORY_CARE_PROVIDER_SITE_OTHER): Payer: Medicare Other | Admitting: Cardiovascular Disease

## 2019-07-09 ENCOUNTER — Other Ambulatory Visit: Payer: Self-pay

## 2019-07-09 VITALS — BP 122/82 | HR 68 | Temp 96.8°F | Ht 69.0 in | Wt 207.0 lb

## 2019-07-09 DIAGNOSIS — Z008 Encounter for other general examination: Secondary | ICD-10-CM

## 2019-07-09 DIAGNOSIS — I4510 Unspecified right bundle-branch block: Secondary | ICD-10-CM

## 2019-07-09 DIAGNOSIS — I2510 Atherosclerotic heart disease of native coronary artery without angina pectoris: Secondary | ICD-10-CM | POA: Diagnosis not present

## 2019-07-09 DIAGNOSIS — E782 Mixed hyperlipidemia: Secondary | ICD-10-CM | POA: Diagnosis not present

## 2019-07-09 NOTE — Assessment & Plan Note (Signed)
History of hyperlipidemia/hypertriglyceridemia on simvastatin and niacin.  Apparently his PCP noted that his triglycerides have been elevated probably related to dietary changes during COVID-19 but these are being addressed.

## 2019-07-09 NOTE — Assessment & Plan Note (Signed)
Chronic. 

## 2019-07-09 NOTE — Progress Notes (Signed)
07/09/2019 Gabriel Kelpandall K Horan   1956/08/20  [id number]  [facility]ician [facility], [facility] Cardiologist: Runell GessJonathan J Illyana Schorsch MD [name], [place], [place], [place]  HPI:  Gabriel [name] is a 63 y.o.    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 He had coronary artery bypass grafting by Dr. Laneta SimmersBartle emergently May 08, 2005, with a LIMA to his LAD, vein to an OM branch and to the PDA after I catheterized him and found subacute thrombosis in his distal left main with three vessel disease, normal LV function. His other problems include hyperlipidemia  Since I saw him a year ago he is done well.  His weight is stable.  He has altered his diet as result of COVID-19 and has had some increased triglycerides being addressed by his PCP.  He denies chest pain or shortness of breath.   Current Meds  Medication Sig  . ALPRAZolam (XANAX) 1 MG tablet Take 1 mg by mouth 3 (three) times daily as needed for sleep.   . ASPIRIN 81 PO Take 81 mg by mouth daily.  [name]. buPROPion (WELLBUTRIN SR) 150 MG 12 hr tablet Take 150 mg by mouth 2 (two) times daily.  . cholecalciferol (VITAMIN D) 1000 UNITS tablet Take 1,000 Units by mouth 2 (two) times daily. 2 tabs bid for a total of 2000 units.  . clonazePAM (KLONOPIN) 2 MG tablet Take 2 mg by mouth daily.   . fexofenadine (ALLEGRA) 180 MG tablet Take 180 mg by mouth as needed for allergies or rhinitis.  . fish oil-omega-3 fatty acids 1000 MG capsule Take 1,000 mg by mouth 2 (two) times daily.  . hydrOXYzine (VISTARIL) 25 MG capsule Take 1 capsule by mouth as directed.  . metoprolol succinate (TOPROL-XL) 25 MG 24 hr tablet TAKE 3 TABLETS BY MOUTH DAILY  . niacin (NIASPAN) 1000 MG CR tablet TAKE ONE TABLET BY MOUTH AT BEDTIME  . sertraline (ZOLOFT) 100 MG tablet Take 100 mg by mouth daily.  . simvastatin (ZOCOR) 40 MG  tablet TAKE ONE TABLET BY MOUTH AT BEDTIME  . Suvorexant (BELSOMRA) 20 MG TABS Take 20 mg by mouth at bedtime.  . tadalafil (ADCIRCA/CIALIS) 20 MG tablet Take 20 mg by mouth as directed.  . zolpidem (AMBIEN CR) 12.5 MG CR tablet Take 12.5 mg by mouth at bedtime as needed for sleep.     Allergies  Allergen Reactions  . Altace [Ramipril]   . Lipitor [Atorvastatin]   . Penicillins   . Sulfa Antibiotics   . Trazodone And Nefazodone   . Latex Rash    Social History   Socioeconomic History  . Marital status: Divorced    Spouse name: Not on file  . Number of children: Not on file  . Years of education: Not on file  . Highest education level: Not on file  Occupational History  . Not on file  Social Needs  . Financial resource strain: Not on file  . Food insecurity    Worry: Not on file    Inability: Not on file  . Transportation needs    Medical: Not on file    Non-medical: Not on file  Tobacco Use  . Smoking status: Former Smoker    Types: Cigarettes    Quit date: 11/11/2002    Years since quitting: 16.6  . Smokeless tobacco: Former NeurosurgeonUser  Substance and Sexual Activity  . Alcohol use: Not on file  . Drug use: Not on file  . Sexual activity: Not on file  Lifestyle  . Physical activity    Days per week: Not on file    Minutes per session: Not on file  . Stress: Not on file  Relationships  . Social Herbalist on phone: Not on file    Gets together: Not on file    Attends religious service: Not on file    Active member of club or organization: Not on file    Attends meetings of clubs or organizations: Not on file    Relationship status: Not on file  . Intimate partner violence    Fear of current or ex partner: Not on file    Emotionally abused: Not on file    Physically abused: Not on file    Forced sexual activity: Not on file  Other Topics Concern  . Not on file  Social History Narrative  . Not on file     Review of Systems: General: negative for  chills, fever, night sweats or weight changes.  Cardiovascular: negative for chest pain, dyspnea on exertion, edema, orthopnea, palpitations, paroxysmal nocturnal dyspnea or shortness of breath Dermatological: negative for rash Respiratory: negative for cough or wheezing Urologic: negative for hematuria Abdominal: negative for nausea, vomiting, diarrhea, bright red blood per rectum, melena, or hematemesis Neurologic: negative for visual changes, syncope, or dizziness All other systems reviewed and are otherwise negative except as noted above.    Blood pressure 122/82, pulse 68, temperature (!) 96.8 F (36 C), height 5\' 9"  (1.753 m), weight 207 lb (93.9 kg).  General appearance: alert and no distress Neck: no adenopathy, no carotid bruit, no JVD, supple, symmetrical, trachea midline and thyroid not enlarged, symmetric, no tenderness/mass/nodules Lungs: clear to auscultation bilaterally Heart: regular rate and rhythm, S1, S2 normal, no murmur, click, rub or gallop Extremities: extremities normal, atraumatic, no cyanosis or edema Pulses: 2+ and symmetric Skin: Skin color, texture, turgor normal. No rashes or lesions Neurologic: Alert and oriented X 3, normal strength and tone. Normal symmetric reflexes. Normal coordination and gait  EKG sinus rhythm at 68 with left axis deviation and right bundle branch block.  I personally reviewed this EKG.  ASSESSMENT AND PLAN:   Coronary artery disease History of CAD status post urgent bypass grafting by Dr. Cyndia Bent 05/08/2005 because of subacute thrombosis of his distal left main with three-vessel disease and normal LV function.  He had a LIMA to his LAD, vein graft to an obtuse marginal branch and PDA.  He denies chest pain or shortness of breath.  Hyperlipidemia History of hyperlipidemia/hypertriglyceridemia on simvastatin and niacin.  Apparently his PCP noted that his triglycerides have been elevated probably related to dietary changes during  COVID-19 but these are being addressed.  Right bundle branch block Chronic      Lorretta Harp MD [facility], [facility] 07/09/2019 4:22 PM

## 2019-07-09 NOTE — Patient Instructions (Signed)

## 2019-07-09 NOTE — Assessment & Plan Note (Signed)
History of CAD status post urgent bypass grafting by Dr. Cyndia Bent 05/08/2005 because of subacute thrombosis of his distal left main with three-vessel disease and normal LV function.  He had a LIMA to his LAD, vein graft to an obtuse marginal branch and PDA.  He denies chest pain or shortness of breath.

## 2019-09-05 ENCOUNTER — Other Ambulatory Visit: Payer: Self-pay | Admitting: Cardiovascular Disease

## 2019-09-05 DIAGNOSIS — E785 Hyperlipidemia, unspecified: Secondary | ICD-10-CM

## 2019-09-05 DIAGNOSIS — Z79899 Other long term (current) drug therapy: Secondary | ICD-10-CM

## 2019-09-08 ENCOUNTER — Other Ambulatory Visit: Payer: Self-pay | Admitting: Cardiovascular Disease

## 2019-09-14 ENCOUNTER — Telehealth: Payer: Self-pay | Admitting: Cardiovascular Disease

## 2019-09-14 DIAGNOSIS — Z79899 Other long term (current) drug therapy: Secondary | ICD-10-CM

## 2019-09-14 DIAGNOSIS — E785 Hyperlipidemia, unspecified: Secondary | ICD-10-CM

## 2019-09-14 MED ORDER — SIMVASTATIN 40 MG PO TABS: 40.0000 mg | ORAL_TABLET | Freq: Every day | ORAL | 2 refills | Status: DC

## 2019-09-14 MED ORDER — METOPROLOL SUCCINATE ER 25 MG PO TB24: 75.0000 mg | ORAL_TABLET | Freq: Every day | ORAL | 1 refills | Status: DC

## 2019-09-14 NOTE — Telephone Encounter (Signed)
Patient is correct- he did have office visit in 06/2019 and advised to follow up in one year. I have sent in his RX with refills.

## 2019-09-14 NOTE — Telephone Encounter (Signed)
New Message     Pt c/o medication issue:  1. Name of Medication: metoprolol   2. How are you currently taking this medication (dosage and times per day)? 25mg  3 tablets once a day   3. Are you having a reaction (difficulty breathing--STAT)? No   4. What is your medication issue? Pt says he gets all of his medication for 3 months, He says his medication was only refilled for 1 month and the pharmacist told him he needed to see the Dr. He says he saw Dr Gwenlyn Found last in August so he is not sure why it was only filled for one month.      Please call

## 2019-12-05 ENCOUNTER — Other Ambulatory Visit: Payer: Self-pay | Admitting: Cardiovascular Disease

## 2020-03-05 ENCOUNTER — Other Ambulatory Visit: Payer: Self-pay | Admitting: Cardiovascular Disease

## 2020-03-07 ENCOUNTER — Other Ambulatory Visit: Payer: Self-pay | Admitting: Cardiovascular Disease

## 2020-03-10 ENCOUNTER — Telehealth: Payer: Self-pay

## 2020-03-10 ENCOUNTER — Telehealth: Payer: Self-pay | Admitting: Cardiovascular Disease

## 2020-03-10 DIAGNOSIS — Z79899 Other long term (current) drug therapy: Secondary | ICD-10-CM

## 2020-03-10 DIAGNOSIS — E785 Hyperlipidemia, unspecified: Secondary | ICD-10-CM

## 2020-03-10 MED ORDER — METOPROLOL SUCCINATE ER 25 MG PO TB24: 25.0000 mg | ORAL_TABLET | Freq: Every day | ORAL | 3 refills | Status: DC

## 2020-03-10 MED ORDER — SIMVASTATIN 40 MG PO TABS: 40.0000 mg | ORAL_TABLET | Freq: Every day | ORAL | 3 refills | Status: DC

## 2020-03-10 NOTE — Telephone Encounter (Signed)
*  STAT* If patient is at the pharmacy, call can be transferred to refill team.   1. Which medications need to be refilled? (please list name of each medication and dose if known) metoprolol succinate (TOPROL-XL) 25 MG 24 hr tablet and simvastatin (ZOCOR) 40 MG tablet  2. Which pharmacy/location (including street and city if local pharmacy) is medication to be sent to? [phone] - [place], [place] - [address]  3. Do they need a 30 day or 90 day supply? 90

## 2020-03-10 NOTE — Telephone Encounter (Signed)
Pt c/o medication issue:  1. Name of Medication: metoprolol succinate (TOPROL-XL) 25 MG 24 hr tablet and simvastatin (ZOCOR) 40 MG tablet  2. How are you currently taking this medication (dosage and times per day)?as directed  3. Are you having a reaction (difficulty breathing--STAT)? no  4. What is your medication issue? Patient wants to speak with a nurse directly about why he keeps having complication with getting refills. He states he never gets to speak with anyone and everytime he goes to get a refill its only for 30 days or they say he needs to see the dr. He has his annual appt scheduled already. I'm sending a refill request up to the refill team.

## 2020-03-10 NOTE — Telephone Encounter (Signed)
[facility] is [facility] needing a clarification on pt's medication Metoprolol 25 mg tablet. Pt stated that he takes 3 tablets (total 75 mg ) daily. This was not sent into pharmacy pharmacy. Pharmacy wanted to know if pt's medication was decreased. Pharmacy would like a call back to clarify this medication. Please address

## 2020-03-10 NOTE — Telephone Encounter (Signed)
Left voicemail for patient. Medications refilled for 90 days with 3 refills, patient was seen in August and has follow up appointment made for this August.

## 2020-03-13 ENCOUNTER — Other Ambulatory Visit: Payer: Self-pay

## 2020-03-13 MED ORDER — METOPROLOL SUCCINATE ER 25 MG PO TB24: 25.0000 mg | ORAL_TABLET | Freq: Every day | ORAL | 3 refills | Status: DC

## 2020-03-13 NOTE — Telephone Encounter (Signed)
Patient states he needs a 90 day refill rather than 30 day refill.

## 2020-03-13 NOTE — Telephone Encounter (Signed)
Patient called stating the he takes 3- 25mg  pills of metroprolol daily for a total of 75mg  daily. He wants to make sure this is what is sent to the pharmacy.

## 2020-03-14 ENCOUNTER — Other Ambulatory Visit: Payer: Self-pay

## 2020-03-14 MED ORDER — METOPROLOL SUCCINATE ER 25 MG PO TB24: ORAL_TABLET | ORAL | 3 refills | Status: DC

## 2020-06-06 ENCOUNTER — Other Ambulatory Visit: Payer: Self-pay | Admitting: Cardiovascular Disease

## 2020-06-13 ENCOUNTER — Ambulatory Visit: Payer: Medicare Other | Admitting: Cardiovascular Disease

## 2020-06-13 ENCOUNTER — Other Ambulatory Visit: Payer: Self-pay

## 2020-06-13 DIAGNOSIS — I2510 Atherosclerotic heart disease of native coronary artery without angina pectoris: Secondary | ICD-10-CM

## 2020-06-13 DIAGNOSIS — I4510 Unspecified right bundle-branch block: Secondary | ICD-10-CM | POA: Diagnosis not present

## 2020-06-13 DIAGNOSIS — E782 Mixed hyperlipidemia: Secondary | ICD-10-CM | POA: Diagnosis not present

## 2020-06-13 NOTE — Assessment & Plan Note (Signed)
Chronic. 

## 2020-06-13 NOTE — Patient Instructions (Addendum)
Medication Instructions:  °Your physician recommends that you continue on your current medications as directed. Please refer to the Current Medication list given to you today. ° °*If you need a refill on your cardiac medications before your next appointment, please call your pharmacy* ° °Lab Work: °NONE ordered at this time of appointment  ° °If you have labs (blood work) drawn today and your tests are completely normal, you will receive your results only by: °MyChart Message (if you have MyChart) OR °A paper copy in the mail °If you have any lab test that is abnormal or we need to change your treatment, we will call you to review the results. ° °Testing/Procedures: °NONE ordered at this time of appointment  ° °Follow-Up: °At [facility], you and your health needs are our priority.  As part of our continuing mission to provide you with exceptional heart care, we have created designated Provider Care Teams.  These Care Teams include your primary Cardiologist (physician) and Advanced Practice Providers (APPs -  Physician Assistants and Nurse Practitioners) who all work together to provide you with the care you need, when you need it. ° °Your next appointment:   °1 year(s) ° °The format for your next appointment:   °In Person ° °Provider:   °Jonathan Berry, MD   ° °Other Instructions ° ° °

## 2020-06-13 NOTE — Assessment & Plan Note (Signed)
History of hyperlipidemia on simvastatin followed by his PCP 

## 2020-06-13 NOTE — Progress Notes (Signed)
06/13/2020 [name]   [birth year]  [id number]  Primary Physician College, [facility] Primary Cardiologist: [name] MD [name], [place], [place], [place]  HPI:  Gabriel [name] is a 64 y.o. HPI:  Gabriel [name] is a 64 y.o.  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 [name] emergently May 08, 2005, with a LIMA to his LAD, vein to an OM branch and to the PDA after I catheterized him and found subacute thrombosis in his distal left main with three vessel disease, normal LV function. His other problems include hyperlipidemia  Since I saw him a year ago he is done well.[name] He denies chest pain or shortness of breath.   Current Meds  Medication Sig  . ALPRAZolam (XANAX) 1 MG tablet Take 1 mg by mouth 3 (three) times daily as needed for sleep.   . ASPIRIN 81 PO Take 81 mg by mouth daily.  [name] buPROPion (WELLBUTRIN SR) 150 MG 12 hr tablet Take 150 mg by mouth 2 (two) times daily.  . cholecalciferol (VITAMIN D) 1000 UNITS tablet Take 1,000 Units by mouth 2 (two) times daily. 2 tabs bid for a total of 2000 units.  . clonazePAM (KLONOPIN) 2 MG tablet Take 2 mg by mouth daily.   . fexofenadine (ALLEGRA) 180 MG tablet Take 180 mg by mouth as needed for allergies or rhinitis.  . fish oil-omega-3 fatty acids 1000 MG capsule Take 1,000 mg by mouth 2 (two) times daily.  . hydrOXYzine (VISTARIL) 25 MG capsule Take 1 capsule by mouth as directed.  . metoprolol succinate (TOPROL-XL) 25 MG 24 hr tablet Take 3 tablets a day.  . niacin (NIASPAN) 1000 MG CR tablet TAKE ONE TABLET BY MOUTH AT BEDTIME  . QUEtiapine (SEROQUEL) 25 MG tablet Take by mouth.  . sertraline (ZOLOFT) 100 MG tablet Take 100 mg by mouth daily.  . simvastatin (ZOCOR) 40 MG tablet Take 1 tablet (40 mg total) by mouth at bedtime.    . tadalafil (ADCIRCA/CIALIS) 20 MG tablet Take 20 mg by mouth as directed.  . zolpidem (AMBIEN CR) 12.5 MG CR tablet Take 12.5 mg by mouth at bedtime as needed for sleep.     Allergies  Allergen Reactions  . Altace [Ramipril]   . Lipitor [Atorvastatin]   . Penicillins   . Sulfa Antibiotics   . Trazodone And Nefazodone   . Latex Rash    Social History   Socioeconomic History  . Marital status: Divorced    Spouse name: Not on file  . Number of children: Not on file  . Years of education: Not on file  . Highest education level: Not on file  Occupational History  . Not on file  Tobacco Use  . Smoking status: Former Smoker    Types: Cigarettes    Quit date: 11/11/2002    Years since quitting: 17.6  . Smokeless tobacco: Former Engineer, water and Sexual Activity  . Alcohol use: Not on file  . Drug use: Not on file  . Sexual activity: Not on file  Other Topics Concern  . Not on file  Social History Narrative  . Not on file   Social Determinants of Health   Financial Resource Strain:   . Difficulty of Paying Living Expenses:   Food Insecurity:   .  Worried About Programme researcher, broadcasting/film/video in the Last Year:   . Barista in the Last Year:   Transportation Needs:   . Freight forwarder (Medical):   [name] Lack of Transportation (Non-Medical):   Physical Activity:   . Days of Exercise per Week:   . Minutes of Exercise per Session:   Stress:   . Feeling of Stress :   Social Connections:   . Frequency of Communication with Friends and Family:   . Frequency of Social Gatherings with Friends and Family:   . Attends Religious Services:   . Active Member of Clubs or Organizations:   . Attends Banker Meetings:   [name] Marital Status:   Intimate Partner Violence:   . Fear of Current or Ex-Partner:   . Emotionally Abused:   [name] Physically Abused:   . Sexually Abused:      Review of Systems: General: negative for chills, fever, night sweats or weight changes.   Cardiovascular: negative for chest pain, dyspnea on exertion, edema, orthopnea, palpitations, paroxysmal nocturnal dyspnea or shortness of breath Dermatological: negative for rash Respiratory: negative for cough or wheezing Urologic: negative for hematuria Abdominal: negative for nausea, vomiting, diarrhea, bright red blood per rectum, melena, or hematemesis Neurologic: negative for visual changes, syncope, or dizziness All other systems reviewed and are otherwise negative except as noted above.    Blood pressure 126/69, pulse 74, height 5\' 9"  (1.753 m), weight 212 lb (96.2 kg), SpO2 96 %.  General appearance: alert and no distress Neck: no adenopathy, no carotid bruit, no JVD, supple, symmetrical, trachea midline and thyroid not enlarged, symmetric, no tenderness/mass/nodules Lungs: clear to auscultation bilaterally Heart: regular rate and rhythm, S1, S2 normal, no murmur, click, rub or gallop Extremities: extremities normal, atraumatic, no cyanosis or edema Pulses: 2+ and symmetric Skin: Skin color, texture, turgor normal. No rashes or lesions Neurologic: Alert and oriented X 3, normal strength and tone. Normal symmetric reflexes. Normal coordination and gait  EKG sinus rhythm at 69 with right bundle branch block and left anterior fascicular block.  I personally reviewed this EKG.  ASSESSMENT AND PLAN:   Coronary artery disease Artery bypass 18 urgently May 09, 2007 by Dr. May 11, 2007 with a LIMA to his LAD, vein to the obtuse marginal branch and PDA after I catheterized him and found a subacute thrombosis of his distal left main and three-vessel disease with normal LV function.  He is done well since.  He denies chest pain or shortness of breath.  History of CAD status post  Hyperlipidemia History of hyperlipidemia on simvastatin followed by his PCP  Right bundle branch block Chronic      [name] MD [facility], [facility] 06/13/2020 5:18 PM

## 2020-06-13 NOTE — Assessment & Plan Note (Addendum)
Artery bypass 18 urgently May 09, 2007 by Dr. [name] with a LIMA to his LAD, vein to the obtuse marginal branch and PDA after I catheterized him and found a subacute thrombosis of his distal left main and three-vessel disease with normal LV function.  He is done well since.  He denies chest pain or shortness of breath.  History of CAD status post

## 2020-07-12 LAB — COLOGUARD

## 2020-08-30 LAB — COLOGUARD: COLOGUARD: NEGATIVE

## 2020-12-05 ENCOUNTER — Other Ambulatory Visit: Payer: Self-pay | Admitting: Cardiovascular Disease

## 2021-03-05 ENCOUNTER — Other Ambulatory Visit: Payer: Self-pay | Admitting: Cardiovascular Disease

## 2021-06-03 ENCOUNTER — Other Ambulatory Visit: Payer: Self-pay | Admitting: Cardiovascular Disease

## 2021-06-04 ENCOUNTER — Other Ambulatory Visit: Payer: Self-pay | Admitting: Cardiovascular Disease

## 2021-06-04 DIAGNOSIS — E785 Hyperlipidemia, unspecified: Secondary | ICD-10-CM

## 2021-06-04 DIAGNOSIS — Z79899 Other long term (current) drug therapy: Secondary | ICD-10-CM

## 2021-07-11 ENCOUNTER — Other Ambulatory Visit: Payer: Self-pay

## 2021-07-11 ENCOUNTER — Ambulatory Visit: Payer: Medicare Other | Admitting: Cardiovascular Disease

## 2021-07-11 ENCOUNTER — Encounter: Payer: Self-pay | Admitting: Cardiovascular Disease

## 2021-07-11 VITALS — BP 134/80 | HR 67 | Ht 69.0 in | Wt 210.0 lb

## 2021-07-11 DIAGNOSIS — I4510 Unspecified right bundle-branch block: Secondary | ICD-10-CM

## 2021-07-11 DIAGNOSIS — E782 Mixed hyperlipidemia: Secondary | ICD-10-CM | POA: Diagnosis not present

## 2021-07-11 DIAGNOSIS — I2510 Atherosclerotic heart disease of native coronary artery without angina pectoris: Secondary | ICD-10-CM | POA: Diagnosis not present

## 2021-07-11 NOTE — Assessment & Plan Note (Signed)
History of hyperlipidemia on statin therapy with lipid profile performed recently by his PCP 06/29/2021 revealing total cholesterol 145, LDL of 71 and HDL 50.

## 2021-07-11 NOTE — Patient Instructions (Signed)

## 2021-07-11 NOTE — Progress Notes (Signed)
07/11/2021 Gabriel [name]   05-27-1956  [id number]  Primary Physician Eartha Inch, MD Primary Cardiologist: [name] MD [name], [place], [place], [place]  HPI:  Gabriel [name] is a 65 y.o.   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 [name] emergently May 08, 2005, with a LIMA to his LAD, vein to an OM branch and to the PDA after I catheterized him and found subacute thrombosis in his distal left main with three vessel disease, normal LV function. His other problems include hyperlipidemia   Since I saw him a year ago he is done well.[name] He denies chest pain or shortness of breath.  He walks about [phone] steps a day.   Current Meds  Medication Sig   ALPRAZolam (XANAX) 1 MG tablet Take 1 mg by mouth 3 (three) times daily as needed for sleep.    ASPIRIN 81 PO Take 81 mg by mouth daily.   buPROPion (WELLBUTRIN SR) 150 MG 12 hr tablet Take 150 mg by mouth 2 (two) times daily.   cholecalciferol (VITAMIN D) 1000 UNITS tablet Take 1,000 Units by mouth 2 (two) times daily. 2 tabs bid for a total of 2000 units.   clonazePAM (KLONOPIN) 2 MG tablet Take 2 mg by mouth daily.   fenofibrate 160 MG tablet Take 1 tablet by mouth daily.   fexofenadine (ALLEGRA) 180 MG tablet Take 180 mg by mouth as needed for allergies or rhinitis.   fish oil-omega-3 fatty acids 1000 MG capsule Take 1,000 mg by mouth 2 (two) times daily.   hydrOXYzine (VISTARIL) 25 MG capsule Take 1 capsule by mouth as directed.   metoprolol succinate (TOPROL-XL) 25 MG 24 hr tablet TAKE 3 TABLETS BY MOUTH EVERY DAY   niacin (NIASPAN) 1000 MG CR tablet TAKE 1 TABLET BY MOUTH EVERYDAY AT BEDTIME   QUEtiapine (SEROQUEL) 25 MG tablet Take by mouth.   sertraline (ZOLOFT) 100 MG tablet Take 100 mg by mouth daily.   simvastatin (ZOCOR) 40 MG tablet TAKE 1  TABLET BY MOUTH EVERYDAY AT BEDTIME   tadalafil (ADCIRCA/CIALIS) 20 MG tablet Take 20 mg by mouth as directed.   zolpidem (AMBIEN CR) 12.5 MG CR tablet Take 12.5 mg by mouth at bedtime as needed for sleep.     Allergies  Allergen Reactions   Altace [Ramipril]    Lipitor [Atorvastatin]    Penicillins    Sulfa Antibiotics    Trazodone And Nefazodone    Latex Rash    Social History   Socioeconomic History   Marital status: Divorced    Spouse name: Not on file   Number of children: Not on file   Years of education: Not on file   Highest education level: Not on file  Occupational History   Not on file  Tobacco Use   Smoking status: Former    Types: Cigarettes    Quit date: 11/11/2002    Years since quitting: 18.6   Smokeless tobacco: Former  Substance and Sexual Activity   Alcohol use: Not on file   Drug use: Not on file   Sexual activity: Not on file  Other Topics Concern   Not on file  Social History Narrative   Not on file   Social Determinants of Health   Financial Resource Strain: Not on file  Food Insecurity: Not on file  Transportation Needs: Not on file  Physical Activity: Not on file  Stress: Not on file  Social Connections: Not on file  Intimate Partner Violence: Not on file     Review of Systems: General: negative for chills, fever, night sweats or weight changes.  Cardiovascular: negative for chest pain, dyspnea on exertion, edema, orthopnea, palpitations, paroxysmal nocturnal dyspnea or shortness of breath Dermatological: negative for rash Respiratory: negative for cough or wheezing Urologic: negative for hematuria Abdominal: negative for nausea, vomiting, diarrhea, bright red blood per rectum, melena, or hematemesis Neurologic: negative for visual changes, syncope, or dizziness All other systems reviewed and are otherwise negative except as noted above.    Blood pressure 134/80, pulse 67, height 5\' 9"  (1.753 m), weight 210 lb (95.3 kg), SpO2 93 %.   General appearance: alert and no distress Neck: no adenopathy, no carotid bruit, no JVD, supple, symmetrical, trachea midline, and thyroid not enlarged, symmetric, no tenderness/mass/nodules Lungs: clear to auscultation bilaterally Heart: regular rate and rhythm, S1, S2 normal, no murmur, click, rub or gallop Extremities: extremities normal, atraumatic, no cyanosis or edema Pulses: 2+ and symmetric Skin: Skin color, texture, turgor normal. No rashes or lesions Neurologic: Grossly normal  EKG sinus rhythm at 67 with right bundle branch block.  I personally reviewed this EKG.  ASSESSMENT AND PLAN:   Coronary artery disease History of CAD status post CABG by Dr. emergently 05/08/2005 LIMA to his LAD, vein to OM branch and the PDA after I catheterized him and found a subacute thrombosis in his distal left main with three-vessel disease and normal LV function.  He has done well since.  He is completely asymptomatic.  Hyperlipidemia History of hyperlipidemia on statin therapy with lipid profile performed recently by his PCP 06/29/2021 revealing total cholesterol 145, LDL of 71 and HDL 50.  Right bundle branch block Chronic     07/01/2021 MD [facility], [facility] 07/11/2021 3:41 PM

## 2021-07-11 NOTE — Assessment & Plan Note (Signed)
History of CAD status post CABG by Dr. [name] emergently 05/08/2005 LIMA to his LAD, vein to OM branch and the PDA after I catheterized him and found a subacute thrombosis in his distal left main with three-vessel disease and normal LV function.  He has done well since.  He is completely asymptomatic.

## 2021-07-11 NOTE — Assessment & Plan Note (Signed)
Chronic. 

## 2021-09-03 ENCOUNTER — Other Ambulatory Visit: Payer: Self-pay | Admitting: Cardiovascular Disease

## 2021-12-01 ENCOUNTER — Other Ambulatory Visit: Payer: Self-pay | Admitting: Cardiovascular Disease

## 2021-12-02 ENCOUNTER — Other Ambulatory Visit: Payer: Self-pay | Admitting: Cardiovascular Disease

## 2022-03-01 ENCOUNTER — Other Ambulatory Visit: Payer: Self-pay | Admitting: Cardiovascular Disease

## 2022-06-02 ENCOUNTER — Other Ambulatory Visit: Payer: Self-pay | Admitting: Cardiovascular Disease

## 2022-06-02 DIAGNOSIS — E785 Hyperlipidemia, unspecified: Secondary | ICD-10-CM

## 2022-06-02 DIAGNOSIS — Z79899 Other long term (current) drug therapy: Secondary | ICD-10-CM

## 2022-06-03 ENCOUNTER — Other Ambulatory Visit: Payer: Self-pay | Admitting: Cardiovascular Disease

## 2022-07-10 ENCOUNTER — Encounter: Payer: Self-pay | Admitting: Cardiovascular Disease

## 2022-07-10 ENCOUNTER — Ambulatory Visit: Payer: Medicare Other | Attending: Cardiovascular Disease | Admitting: Cardiovascular Disease

## 2022-07-10 VITALS — BP 128/74 | HR 64 | Ht 69.0 in | Wt 210.0 lb

## 2022-07-10 DIAGNOSIS — I2510 Atherosclerotic heart disease of native coronary artery without angina pectoris: Secondary | ICD-10-CM

## 2022-07-10 DIAGNOSIS — E782 Mixed hyperlipidemia: Secondary | ICD-10-CM | POA: Diagnosis not present

## 2022-07-10 MED ORDER — ROSUVASTATIN CALCIUM 10 MG PO TABS: 10.0000 mg | ORAL_TABLET | Freq: Every day | ORAL | 3 refills | Status: DC

## 2022-07-10 NOTE — Assessment & Plan Note (Signed)
History of coronary artery disease status post coronary artery bypass grafting by Dr. [name] 05/08/2005 LIMA to his LAD, patent vein graft to OM and to the PDA after I catheterized him and found subacute thrombosis of his left main with three-vessel disease.  He had normal LV function.  Has been asymptomatic since.

## 2022-07-10 NOTE — Assessment & Plan Note (Signed)
History of hyperlipidemia on simvastatin with recent lipid profile performed 07/09/2022 revealing total cholesterol 182, LDL 109 and HDL 48.  This represents an increase of his LDL from 05/13/2018 at which time it was 86.  He is not at goal for secondary prevention.  He has had angioedema to atorvastatin in the past.  I am going to begin him on low-dose rosuvastatin, 10 mg a day, and we will recheck a lipid liver profile in 3 months if he tolerates it.  If he does not tolerate rosuvastatin he may be a candidate for Repatha.

## 2022-07-10 NOTE — Progress Notes (Signed)
07/10/2022 Gabriel [name]   1956-11-08  [id number]  Primary Physician Eartha Inch, MD Primary Cardiologist: [name] MD [name], [place], [place], [place]  HPI:  Gabriel [name] is a 66 y.o.  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 [name] emergently May 08, 2005, with a LIMA to his LAD, vein to an OM branch and to the PDA after I catheterized him and found subacute thrombosis in his distal left main with three vessel disease, normal LV function. His other problems include hyperlipidemia   Since I saw him a year ago he is done well.[name] He denies chest pain or shortness of breath.  He is less active than he was a year ago.  He is planning on taking a 61-month trip to [place] on 10/01/2022, for stopping [place].  Current Meds  Medication Sig   ALPRAZolam (XANAX) 1 MG tablet Take 1 mg by mouth 3 (three) times daily as needed for sleep.    ASPIRIN 81 PO Take 81 mg by mouth daily.   buPROPion (WELLBUTRIN XL) 300 MG 24 hr tablet Take 300 mg by mouth daily.   cholecalciferol (VITAMIN D) 1000 UNITS tablet Take 1,000 Units by mouth 2 (two) times daily. 2 tabs bid for a total of 2000 units.   fenofibrate 160 MG tablet Take 1 tablet by mouth daily.   fexofenadine (ALLEGRA) 180 MG tablet Take 180 mg by mouth as needed for allergies or rhinitis.   fish oil-omega-3 fatty acids 1000 MG capsule Take 1,000 mg by mouth 2 (two) times daily.   hydrOXYzine (VISTARIL) 25 MG capsule Take 1 capsule by mouth as directed.   metoprolol succinate (TOPROL-XL) 25 MG 24 hr tablet TAKE 3 TABLETS BY MOUTH EVERY DAY   niacin (NIASPAN) 1000 MG CR tablet TAKE 1 TABLET BY MOUTH EVERYDAY AT BEDTIME   QUEtiapine (SEROQUEL) 25 MG tablet Take by mouth.   rosuvastatin (CRESTOR) 10 MG tablet Take 1 tablet (10 mg total) by mouth daily.    sertraline (ZOLOFT) 100 MG tablet Take 100 mg by mouth daily.   tadalafil (ADCIRCA/CIALIS) 20 MG tablet Take 20 mg by mouth as directed.   zolpidem (AMBIEN CR) 12.5 MG CR tablet Take 12.5 mg by mouth at bedtime as needed for sleep.   [DISCONTINUED] buPROPion (WELLBUTRIN SR) 150 MG 12 hr tablet Take 150 mg by mouth 2 (two) times daily.   [DISCONTINUED] simvastatin (ZOCOR) 40 MG tablet TAKE 1 TABLET BY MOUTH EVERYDAY AT BEDTIME     Allergies  Allergen Reactions   Altace [Ramipril]    Lipitor [Atorvastatin]    Penicillins    Sulfa Antibiotics    Trazodone And Nefazodone    Latex Rash    Social History   Socioeconomic History   Marital status: Divorced    Spouse name: Not on file   Number of children: Not on file   Years of education: Not on file   Highest education level: Not on file  Occupational History   Not on file  Tobacco Use   Smoking status: Former    Types: Cigarettes    Quit date: 11/11/2002    Years since quitting: 19.6   Smokeless tobacco: Former  Substance and Sexual Activity   Alcohol use: Not on file   Drug use: Not on file  Sexual activity: Not on file  Other Topics Concern   Not on file  Social History Narrative   Not on file   Social Determinants of Health   Financial Resource Strain: Not on file  Food Insecurity: Not on file  Transportation Needs: Not on file  Physical Activity: Not on file  Stress: Not on file  Social Connections: Not on file  Intimate Partner Violence: Not on file     Review of Systems: General: negative for chills, fever, night sweats or weight changes.  Cardiovascular: negative for chest pain, dyspnea on exertion, edema, orthopnea, palpitations, paroxysmal nocturnal dyspnea or shortness of breath Dermatological: negative for rash Respiratory: negative for cough or wheezing Urologic: negative for hematuria Abdominal: negative for nausea, vomiting, diarrhea, bright red blood per rectum, melena, or hematemesis Neurologic:  negative for visual changes, syncope, or dizziness All other systems reviewed and are otherwise negative except as noted above.    Blood pressure 128/74, pulse 64, height 5\' 9"  (1.753 m), weight 210 lb (95.3 kg).  General appearance: alert and no distress Neck: no adenopathy, no carotid bruit, no JVD, supple, symmetrical, trachea midline, and thyroid not enlarged, symmetric, no tenderness/mass/nodules Lungs: clear to auscultation bilaterally Heart: regular rate and rhythm, S1, S2 normal, no murmur, click, rub or gallop Extremities: extremities normal, atraumatic, no cyanosis or edema Pulses: 2+ and symmetric Skin: Skin color, texture, turgor normal. No rashes or lesions Neurologic: Grossly normal  EKG sinus rhythm at 64 with right bundle branch block and left anterior fascicular block (bifascicular block).  I personally reviewed this EKG.  ASSESSMENT AND PLAN:   Coronary artery disease History of coronary artery disease status post coronary artery bypass grafting by Dr. 05/08/2005 LIMA to his LAD, patent vein graft to OM and to the PDA after I catheterized him and found subacute thrombosis of his left main with three-vessel disease.  He had normal LV function.  Has been asymptomatic since.  Hyperlipidemia History of hyperlipidemia on simvastatin with recent lipid profile performed 07/09/2022 revealing total cholesterol 182, LDL 109 and HDL 48.  This represents an increase of his LDL from 05/13/2018 at which time it was 86.  He is not at goal for secondary prevention.  He has had angioedema to atorvastatin in the past.  I am going to begin him on low-dose rosuvastatin, 10 mg a day, and we will recheck a lipid liver profile in 3 months if he tolerates it.  If he does not tolerate rosuvastatin he may be a candidate for Repatha.     07/14/2018 MD [name],[name],[name], [facility] 07/10/2022 4:25 PM

## 2022-07-10 NOTE — Patient Instructions (Signed)
Medication Instructions:   -Stop taking simvastatin (zocor).  -Start taking rosuvastatin (crestor) 10mg  once daily.  *If you need a refill on your cardiac medications before your next appointment, please call your pharmacy*   Lab Work: Your physician recommends that you return for lab work in: 3 months for FASTING lipid/liver panel  If you have labs (blood work) drawn today and your tests are completely normal, you will receive your results only by: MyChart Message (if you have MyChart) OR A paper copy in the mail If you have any lab test that is abnormal or we need to change your treatment, we will call you to review the results.    Follow-Up: At [facility], you and your health needs are our priority.  As part of our continuing mission to provide you with exceptional heart care, we have created designated Provider Care Teams.  These Care Teams include your primary Cardiologist (physician) and Advanced Practice Providers (APPs -  Physician Assistants and Nurse Practitioners) who all work together to provide you with the care you need, when you need it.  We recommend signing up for the patient portal called "MyChart".  Sign up information is provided on this After Visit Summary.  MyChart is used to connect with patients for Virtual Visits (Telemedicine).  Patients are able to view lab/test results, encounter notes, upcoming appointments, etc.  Non-urgent messages can be sent to your provider as well.   To learn more about what you can do with MyChart, go to [facility].    Your next appointment:   12 month(s)  The format for your next appointment:   In Person  Provider:   [email or web address], MD

## 2022-09-06 ENCOUNTER — Other Ambulatory Visit: Payer: Self-pay | Admitting: Cardiovascular Disease

## 2022-09-07 ENCOUNTER — Encounter: Payer: Self-pay | Admitting: Cardiovascular Disease

## 2022-09-07 DIAGNOSIS — E782 Mixed hyperlipidemia: Secondary | ICD-10-CM

## 2022-09-09 MED ORDER — NIACIN ER (ANTIHYPERLIPIDEMIC) 1000 MG PO TBCR: EXTENDED_RELEASE_TABLET | ORAL | 3 refills | Status: DC

## 2022-09-10 LAB — HEPATIC FUNCTION PANEL
ALT: 38 IU/L (ref 0–44)
AST: 42 IU/L — ABNORMAL HIGH (ref 0–40)
Albumin: 4.6 g/dL (ref 3.9–4.9)
Alkaline Phosphatase: 67 IU/L (ref 44–121)
Bilirubin Total: 0.6 mg/dL (ref 0.0–1.2)
Bilirubin, Direct: 0.18 mg/dL (ref 0.00–0.40)
Total Protein: 7.2 g/dL (ref 6.0–8.5)

## 2022-09-10 LAB — LIPID PANEL
Chol/HDL Ratio: 4.4 ratio (ref 0.0–5.0)
Cholesterol, Total: 199 mg/dL (ref 100–199)
HDL: 45 mg/dL (ref >39)
LDL Chol Calc (NIH): 115 mg/dL — ABNORMAL HIGH (ref 0–99)
Triglycerides: 224 mg/dL — ABNORMAL HIGH (ref 0–149)
VLDL Cholesterol Cal: 39 mg/dL (ref 5–40)

## 2022-09-11 ENCOUNTER — Telehealth: Payer: Self-pay

## 2022-09-11 NOTE — Telephone Encounter (Signed)
Left message for pt to call back  °

## 2022-09-11 NOTE — Telephone Encounter (Signed)
-----   Message from Lorretta Harp, MD sent at 09/11/2022  6:56 AM EDT ----- LDL 115, not at goal for secondary prevention on Crestor 10 mg. LDL goal <70. Increase Crestor to 40 mg. Re check FLP 3 months

## 2022-09-17 MED ORDER — ROSUVASTATIN CALCIUM 40 MG PO TABS: 40.0000 mg | ORAL_TABLET | Freq: Every day | ORAL | 3 refills | Status: DC

## 2022-09-17 NOTE — Addendum Note (Signed)
Addended by: Beatrix Fetters on: 09/17/2022 09:30 AM   Modules accepted: Orders

## 2022-09-17 NOTE — Telephone Encounter (Signed)
Spoke with pt, see mychart encounter for additional details. Closing this encounter.

## 2022-09-17 NOTE — Telephone Encounter (Signed)
Per Dr. Gwenlyn Found, LDL 115, not at goal for secondary prevention on Crestor 10 mg. LDL goal <70. Increase Crestor to 40 mg. Re check FLP 3 months.   Spoke with pt regarding recent lab work and Dr. Kennon Holter recommendations. Pt is agreeable to medication changes. Prescription sent to pt's pharmacy of choice. Lab orders placed and mailed to pt's home address. Pt verbalizes understanding.

## 2022-09-17 NOTE — Telephone Encounter (Signed)
Pt is returning call. Transferred to Abner Greenspan, RN

## 2023-01-29 LAB — LIPID PANEL
Chol/HDL Ratio: 2.8 ratio (ref 0.0–5.0)
Cholesterol, Total: 121 mg/dL (ref 100–199)
HDL: 44 mg/dL (ref >39)
LDL Chol Calc (NIH): 59 mg/dL (ref 0–99)
Triglycerides: 96 mg/dL (ref 0–149)
VLDL Cholesterol Cal: 18 mg/dL (ref 5–40)

## 2023-01-29 LAB — HEPATIC FUNCTION PANEL
ALT: 23 IU/L (ref 0–44)
AST: 30 IU/L (ref 0–40)
Albumin: 4.2 g/dL (ref 3.9–4.9)
Alkaline Phosphatase: 61 IU/L (ref 44–121)
Bilirubin Total: 0.4 mg/dL (ref 0.0–1.2)
Bilirubin, Direct: 0.15 mg/dL (ref 0.00–0.40)
Total Protein: 7.1 g/dL (ref 6.0–8.5)

## 2023-06-10 ENCOUNTER — Other Ambulatory Visit: Payer: Self-pay | Admitting: Cardiovascular Disease

## 2023-07-05 ENCOUNTER — Other Ambulatory Visit: Payer: Self-pay | Admitting: Cardiovascular Disease

## 2023-07-18 ENCOUNTER — Ambulatory Visit: Payer: Medicare Other | Admitting: Cardiovascular Disease

## 2023-07-22 ENCOUNTER — Encounter: Payer: Self-pay | Admitting: Cardiovascular Disease

## 2023-07-22 ENCOUNTER — Ambulatory Visit: Payer: Medicare Other | Attending: Cardiovascular Disease | Admitting: Cardiovascular Disease

## 2023-07-22 VITALS — BP 120/60 | HR 64 | Ht 69.0 in | Wt 187.0 lb

## 2023-07-22 DIAGNOSIS — I4510 Unspecified right bundle-branch block: Secondary | ICD-10-CM

## 2023-07-22 DIAGNOSIS — I2510 Atherosclerotic heart disease of native coronary artery without angina pectoris: Secondary | ICD-10-CM | POA: Diagnosis not present

## 2023-07-22 DIAGNOSIS — E782 Mixed hyperlipidemia: Secondary | ICD-10-CM

## 2023-07-22 NOTE — Assessment & Plan Note (Signed)
Chronic. 

## 2023-07-22 NOTE — Progress Notes (Signed)
07/22/2023 Gabriel [name]   09-10-1956  [id number]  Primary Physician Eartha Inch, MD Primary Cardiologist: [name] MD [name], [place], [place], [place]  HPI:  Gabriel [name] is a 67 y.o.   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 [name] emergently May 08, 2005, with a LIMA to his LAD, vein to an OM branch and to the PDA after I catheterized him and found subacute thrombosis in his distal left main with three vessel disease, normal LV function. His other problems include hyperlipidemia   Since I saw him a year ago he is done well.[name] He denies chest pain or shortness of breath.  [place]  He has lost about 25 pounds since I last saw him.  He denies chest pain or shortness of breath.  Current Meds  Medication Sig   ALPRAZolam (XANAX) 1 MG tablet Take 1 mg by mouth in the morning, at noon, in the evening, and at bedtime.   ASPIRIN 81 PO Take 81 mg by mouth daily.   buPROPion (WELLBUTRIN XL) 300 MG 24 hr tablet Take 300 mg by mouth daily.   cholecalciferol (VITAMIN D) 1000 UNITS tablet Take 1,000 Units by mouth 2 (two) times daily. 2 tabs bid for a total of 2000 units.   fenofibrate 160 MG tablet Take 1 tablet by mouth daily.   fish oil-omega-3 fatty acids 1000 MG capsule Take 1,000 mg by mouth 2 (two) times daily.   metoprolol succinate (TOPROL-XL) 25 MG 24 hr tablet TAKE 3 TABLETS BY MOUTH EVERY DAY   niacin (NIASPAN) 1000 MG CR tablet TAKE 1 TABLET BY MOUTH EVERYDAY AT BEDTIME   rosuvastatin (CRESTOR) 40 MG tablet TAKE 1 TABLET BY MOUTH EVERY DAY   sertraline (ZOLOFT) 100 MG tablet Take 100 mg by mouth daily.   tadalafil (ADCIRCA/CIALIS) 20 MG tablet Take 20 mg by mouth as directed.   zolpidem (AMBIEN CR) 12.5 MG CR tablet Take 12.5 mg by  mouth at bedtime as needed for sleep.     Allergies  Allergen Reactions   Altace [Ramipril]    Lipitor [Atorvastatin]    Penicillins    Sulfa Antibiotics    Trazodone And Nefazodone    Latex Rash    Social History   Socioeconomic History   Marital status: Divorced    Spouse name: Not on file   Number of children: Not on file   Years of education: Not on file   Highest education level: Not on file  Occupational History   Not on file  Tobacco Use   Smoking status: Former    Current packs/day: 0.00    Types: Cigarettes    Quit date: 11/11/2002    Years since quitting: 20.7   Smokeless tobacco: Former  Substance and Sexual Activity   Alcohol use: Not on file   Drug use: Not on file   Sexual activity: Not on file  Other Topics Concern   Not on file  Social History Narrative   Not on file   Social Determinants of Health   Financial Resource Strain: Low Risk  (03/14/2023)   Received from [facility]   Overall Financial Resource Strain (CARDIA)    Difficulty of Paying Living Expenses: Not very hard  Food  Insecurity: No Food Insecurity (03/14/2023)   Received from [facility]   Hunger Vital Sign    Worried About Running Out of Food in the Last Year: Never true    Ran Out of Food in the Last Year: Never true  Transportation Needs: No Transportation Needs (03/14/2023)   Received from [facility] - Transportation    Lack of Transportation (Medical): No    Lack of Transportation (Non-Medical): No  Physical Activity: Sufficiently Active (03/14/2023)   Received from [facility]   Exercise Vital Sign    Days of Exercise per Week: 3 days    Minutes of Exercise per Session: 60 min  Stress: Stress Concern Present (03/14/2023)   Received from [facility] - Occupational Stress Questionnaire    Feeling of Stress : To some extent  Social Connections: Somewhat Isolated (03/14/2023)   Received from [facility]   Social  Network    How would you rate your social network (family, work, friends)?: Restricted participation with some degree of social isolation  Intimate Partner Violence: Not At Risk (03/14/2023)   Received from [facility]   HITS    Over the last 12 months how often did your partner physically hurt you?: 1    Over the last 12 months how often did your partner insult you or talk down to you?: 1    Over the last 12 months how often did your partner threaten you with physical harm?: 1    Over the last 12 months how often did your partner scream or curse at you?: 1     Review of Systems: General: negative for chills, fever, night sweats or weight changes.  Cardiovascular: negative for chest pain, dyspnea on exertion, edema, orthopnea, palpitations, paroxysmal nocturnal dyspnea or shortness of breath Dermatological: negative for rash Respiratory: negative for cough or wheezing Urologic: negative for hematuria Abdominal: negative for nausea, vomiting, diarrhea, bright red blood per rectum, melena, or hematemesis Neurologic: negative for visual changes, syncope, or dizziness All other systems reviewed and are otherwise negative except as noted above.    Blood pressure 120/60, pulse 64, height 5\' 9"  (1.753 m), weight 187 lb (84.8 kg).  General appearance: alert and no distress Neck: no adenopathy, no carotid bruit, no JVD, supple, symmetrical, trachea midline, and thyroid not enlarged, symmetric, no tenderness/mass/nodules Lungs: clear to auscultation bilaterally Heart: Regular rate and rhythm without murmurs gallops rubs or clicks Extremities: extremities normal, atraumatic, no cyanosis or edema Pulses: 2+ and symmetric Skin: Skin color, texture, turgor normal. No rashes or lesions Neurologic: Grossly normal  EKG normal sinus rhythm with bifascicular block (right bundle branch blocks/left anterior fascicular block).     ASSESSMENT AND PLAN:   Coronary artery disease History of CAD status  post CABG performed with Dr. [name] emergently 05/08/2005 with a LIMA to his LAD, vein to an OM and PDA after I catheterized him and found to have acute thrombosis of his distal left main with three-vessel disease.  He ultimately did well and has been asymptomatic.  Hyperlipidemia History of hyperlipidemia on fenofibrate and Crestor with lipid profile performed 01/28/2023 revealing a total cholesterol of 121, LDL 59 and HDL 44.  Right bundle branch block Chronic     [name] MD [facility], [facility] 07/22/2023 3:57 PM

## 2023-07-22 NOTE — Assessment & Plan Note (Signed)
History of hyperlipidemia on fenofibrate and Crestor with lipid profile performed 01/28/2023 revealing a total cholesterol of 121, LDL 59 and HDL 44.

## 2023-07-22 NOTE — Assessment & Plan Note (Signed)
History of CAD status post CABG performed with Dr. [name] emergently 05/08/2005 with a LIMA to his LAD, vein to an OM and PDA after I catheterized him and found to have acute thrombosis of his distal left main with three-vessel disease.  He ultimately did well and has been asymptomatic.

## 2023-07-22 NOTE — Patient Instructions (Signed)

## 2023-09-10 ENCOUNTER — Other Ambulatory Visit: Payer: Self-pay | Admitting: Cardiovascular Disease

## 2024-06-10 ENCOUNTER — Other Ambulatory Visit: Payer: Self-pay | Admitting: Cardiovascular Disease

## 2024-07-19 ENCOUNTER — Ambulatory Visit: Attending: Cardiovascular Disease | Admitting: Cardiovascular Disease

## 2024-07-19 ENCOUNTER — Encounter: Payer: Self-pay | Admitting: Cardiovascular Disease

## 2024-07-19 VITALS — BP 92/60 | HR 60 | Ht 69.0 in | Wt 198.0 lb

## 2024-07-19 DIAGNOSIS — E782 Mixed hyperlipidemia: Secondary | ICD-10-CM

## 2024-07-19 DIAGNOSIS — I2510 Atherosclerotic heart disease of native coronary artery without angina pectoris: Secondary | ICD-10-CM

## 2024-07-19 DIAGNOSIS — I4510 Unspecified right bundle-branch block: Secondary | ICD-10-CM | POA: Diagnosis not present

## 2024-07-19 NOTE — Patient Instructions (Signed)

## 2024-07-19 NOTE — Assessment & Plan Note (Signed)
 Chronic

## 2024-07-19 NOTE — Assessment & Plan Note (Signed)
 History of hyperlipidemia on high-dose statin therapy lipid profile performed 01/28/2023 revealing total cholesterol 121, LDL 59 and HDL of 44.

## 2024-07-19 NOTE — Assessment & Plan Note (Signed)
 History of CAD status post CABG by Dr. Albertus emergently 05/08/2005 LIMA to his LAD, vein to OM branch and PDA after I catheterized him and found subacute thrombosis in his distal left main with three-vessel disease and normal LV function.  He has done well since.  He denies chest pain or shortness of breath.

## 2024-07-19 NOTE — Progress Notes (Signed)
 07/19/2024 Gabriel [name]   1955-12-01  [id number]  Primary Physician Sophronia Ozell BROCKS, MD Primary Cardiologist: Dorn JINNY Lesches MD [name], [place], [place], [place]  HPI:  Gabriel [name] is a 68 y.o.  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 Lucas emergently May 08, 2005, with a LIMA to his LAD, vein to an OM branch and to the PDA after I catheterized him and found subacute thrombosis in his distal left main with three vessel disease, normal LV function. His other problems include hyperlipidemia   Since I saw him a year ago he is done well.SABRA He denies chest pain or shortness of breath.  His major complaint is lack of sleep which he is addressing with his psychiatrist.   Current Meds  Medication Sig   ALPRAZolam (XANAX) 1 MG tablet Take 1 mg by mouth in the morning, at noon, in the evening, and at bedtime.   ASPIRIN 81 PO Take 81 mg by mouth daily.   buPROPion (WELLBUTRIN XL) 300 MG 24 hr tablet Take 300 mg by mouth daily.   cholecalciferol (VITAMIN D ) 1000 UNITS tablet Take 1,000 Units by mouth 2 (two) times daily. 2 tabs bid for a total of 2000 units.   fenofibrate 160 MG tablet Take 1 tablet by mouth daily.   fish oil-omega-3 fatty acids 1000 MG capsule Take 1,000 mg by mouth 2 (two) times daily.   metoprolol  succinate (TOPROL -XL) 25 MG 24 hr tablet TAKE 3 TABLETS BY MOUTH EVERY DAY   niacin  (NIASPAN ) 1000 MG CR tablet TAKE 1 TABLET BY MOUTH EVERYDAY AT BEDTIME   rosuvastatin  (CRESTOR ) 10 MG tablet TAKE 1 TABLET BY MOUTH EVERY DAY   rosuvastatin  (CRESTOR ) 40 MG tablet TAKE 1 TABLET BY MOUTH EVERY DAY   sertraline (ZOLOFT) 100 MG tablet Take 100 mg by mouth daily.   tadalafil (ADCIRCA/CIALIS) 20 MG tablet Take 20 mg by mouth as directed.   zolpidem (AMBIEN CR) 12.5 MG CR tablet Take 12.5 mg by mouth at  bedtime as needed for sleep.     Allergies  Allergen Reactions   Altace [Ramipril]    Lipitor [Atorvastatin]    Penicillins    Sulfa Antibiotics    Trazodone And Nefazodone    Gabapentin Rash   Latex Rash    Social History   Socioeconomic History   Marital status: Divorced    Spouse name: Not on file   Number of children: Not on file   Years of education: Not on file   Highest education level: Not on file  Occupational History   Not on file  Tobacco Use   Smoking status: Former    Current packs/day: 0.00    Types: Cigarettes    Quit date: 11/11/2002    Years since quitting: 21.7   Smokeless tobacco: Former  Substance and Sexual Activity   Alcohol use: Not on file   Drug use: Not on file   Sexual activity: Not on file  Other Topics Concern   Not on file  Social History Narrative   Not on file   Social Drivers of Health   Financial Resource Strain: Low Risk  (03/17/2024)   Received from [facility]   Overall Financial Resource Strain (CARDIA)    Difficulty of Paying Living Expenses: Not very hard  Food Insecurity: No Food  Insecurity (03/17/2024)   Received from [facility]   Hunger Vital Sign    Within the past 12 months, you worried that your food would run out before you got the money to buy more.: Never true    Within the past 12 months, the food you bought just didn't last and you didn't have money to get more.: Never true  Transportation Needs: No Transportation Needs (03/17/2024)   Received from [facility]   PRAPARE - Transportation    Lack of Transportation (Medical): No    Lack of Transportation (Non-Medical): No  Physical Activity: Insufficiently Active (03/17/2024)   Received from [facility]   Exercise Vital Sign    On average, how many days per week do you engage in moderate to strenuous exercise (like a brisk walk)?: 2 days    On average, how many minutes do you engage in exercise at this level?: 30 min  Stress: Stress Concern Present (03/17/2024)    Received from [facility] - Occupational Stress Questionnaire    Feeling of Stress : To some extent  Social Connections: Somewhat Isolated (03/17/2024)   Received from [facility]   Social Network    How would you rate your social network (family, work, friends)?: Restricted participation with some degree of social isolation  Intimate Partner Violence: Not At Risk (03/17/2024)   Received from [facility]   HITS    Over the last 12 months how often did your partner physically hurt you?: Never    Over the last 12 months how often did your partner insult you or talk down to you?: Never    Over the last 12 months how often did your partner threaten you with physical harm?: Never    Over the last 12 months how often did your partner scream or curse at you?: Never     Review of Systems: General: negative for chills, fever, night sweats or weight changes.  Cardiovascular: negative for chest pain, dyspnea on exertion, edema, orthopnea, palpitations, paroxysmal nocturnal dyspnea or shortness of breath Dermatological: negative for rash Respiratory: negative for cough or wheezing Urologic: negative for hematuria Abdominal: negative for nausea, vomiting, diarrhea, bright red blood per rectum, melena, or hematemesis Neurologic: negative for visual changes, syncope, or dizziness All other systems reviewed and are otherwise negative except as noted above.    Blood pressure 92/60, pulse 60, height 5' 9 (1.753 m), weight 198 lb (89.8 kg).  General appearance: alert and no distress Neck: no adenopathy, no carotid bruit, no JVD, supple, symmetrical, trachea midline, and thyroid not enlarged, symmetric, no tenderness/mass/nodules Lungs: clear to auscultation bilaterally Heart: regular rate and rhythm, S1, S2 normal, no murmur, click, rub or gallop Extremities: extremities normal, atraumatic, no cyanosis or edema Pulses: 2+ and symmetric Skin: Skin  color, texture, turgor normal. No rashes or lesions Neurologic: Grossly normal  EKG EKG Interpretation Date/Time:  Monday July 19 2024 15:04:28 EDT Ventricular Rate:  60 PR Interval:  162 QRS Duration:  136 QT Interval:  448 QTC Calculation: 448 R Axis:   -57  Text Interpretation: Normal sinus rhythm Left axis deviation Right bundle branch block When compared with ECG of 22-Jul-2023 15:36, Nonspecific T wave abnormality now evident in Inferior leads Confirmed by Court Carrier [phone]) on 07/19/2024 3:12:21 PM    ASSESSMENT AND PLAN:   Coronary artery disease History of CAD status post CABG by Dr. Albertus emergently 05/08/2005 LIMA to his LAD, vein to OM branch and PDA  after I catheterized him and found subacute thrombosis in his distal left main with three-vessel disease and normal LV function.  He has done well since.  He denies chest pain or shortness of breath.  Hyperlipidemia History of hyperlipidemia on high-dose statin therapy lipid profile performed 01/28/2023 revealing total cholesterol 121, LDL 59 and HDL of 44.  Right bundle branch block Chronic     Dorn DOROTHA Lesches MD [facility], [facility] 07/19/2024 3:26 PM

## 2024-09-10 ENCOUNTER — Other Ambulatory Visit: Payer: Self-pay | Admitting: Cardiovascular Disease

## 2024-09-10 NOTE — Telephone Encounter (Signed)
 Pt of Dr. Court. Does Dr. Court want to refill this RX? Please advise.

## 2024-09-13 ENCOUNTER — Encounter: Payer: Self-pay | Admitting: Cardiovascular Disease

## 2024-09-13 DIAGNOSIS — I2510 Atherosclerotic heart disease of native coronary artery without angina pectoris: Secondary | ICD-10-CM

## 2024-09-13 DIAGNOSIS — E782 Mixed hyperlipidemia: Secondary | ICD-10-CM

## 2024-09-14 MED ORDER — NIACIN ER (ANTIHYPERLIPIDEMIC) 1000 MG PO TBCR: EXTENDED_RELEASE_TABLET | ORAL | 3 refills | Status: AC

## 2024-09-30 NOTE — Addendum Note (Signed)
 Addended by: LORRENE FEDERICO CROME on: 09/30/2024 03:38 PM   Modules accepted: Orders

## 2024-10-28 ENCOUNTER — Ambulatory Visit (HOSPITAL_COMMUNITY): Admission: RE | Admit: 2024-10-28

## 2024-10-28 ENCOUNTER — Ambulatory Visit: Payer: Self-pay | Admitting: Cardiovascular Disease

## 2024-10-28 DIAGNOSIS — E782 Mixed hyperlipidemia: Secondary | ICD-10-CM | POA: Insufficient documentation

## 2024-10-28 DIAGNOSIS — I2510 Atherosclerotic heart disease of native coronary artery without angina pectoris: Secondary | ICD-10-CM | POA: Diagnosis present
